# Patient Record
Sex: Female | Born: 1985 | Race: White | Hispanic: No | Marital: Married | Smoking: Current some day smoker
Health system: Southern US, Community
[De-identification: ages and names within clinical notes are randomized; demographics above are authoritative.]

## PROBLEM LIST (undated history)

## (undated) DIAGNOSIS — N200 Calculus of kidney: Secondary | ICD-10-CM

## (undated) DIAGNOSIS — F32A Depression, unspecified: Secondary | ICD-10-CM

## (undated) DIAGNOSIS — F329 Major depressive disorder, single episode, unspecified: Secondary | ICD-10-CM

## (undated) DIAGNOSIS — F419 Anxiety disorder, unspecified: Secondary | ICD-10-CM

## (undated) DIAGNOSIS — N159 Renal tubulo-interstitial disease, unspecified: Secondary | ICD-10-CM

## (undated) DIAGNOSIS — F4310 Post-traumatic stress disorder, unspecified: Secondary | ICD-10-CM

## (undated) HISTORY — PX: CHOLECYSTECTOMY: SHX55

## (undated) HISTORY — PX: TONSILLECTOMY: SUR1361

---
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
## 2007-05-10 ENCOUNTER — Emergency Department (HOSPITAL_COMMUNITY): Admission: EM | Admit: 2007-05-10 | Discharge: 2007-05-10 | Payer: Self-pay | Admitting: Emergency Medicine

## 2007-12-03 ENCOUNTER — Emergency Department (HOSPITAL_COMMUNITY): Admission: EM | Admit: 2007-12-03 | Discharge: 2007-12-03 | Payer: Self-pay | Admitting: Emergency Medicine

## 2007-12-12 ENCOUNTER — Encounter (HOSPITAL_COMMUNITY): Admission: RE | Admit: 2007-12-12 | Discharge: 2008-01-11 | Payer: Self-pay | Admitting: Emergency Medicine

## 2007-12-12 ENCOUNTER — Ambulatory Visit (HOSPITAL_COMMUNITY): Payer: Self-pay | Admitting: Emergency Medicine

## 2008-03-31 ENCOUNTER — Emergency Department (HOSPITAL_COMMUNITY): Admission: EM | Admit: 2008-03-31 | Discharge: 2008-03-31 | Payer: Self-pay | Admitting: Emergency Medicine

## 2008-07-11 ENCOUNTER — Emergency Department (HOSPITAL_COMMUNITY): Admission: EM | Admit: 2008-07-11 | Discharge: 2008-07-11 | Payer: Self-pay | Admitting: Emergency Medicine

## 2008-08-28 ENCOUNTER — Other Ambulatory Visit: Admission: RE | Admit: 2008-08-28 | Discharge: 2008-08-28 | Payer: Self-pay | Admitting: Obstetrics and Gynecology

## 2008-09-09 ENCOUNTER — Emergency Department (HOSPITAL_COMMUNITY): Admission: EM | Admit: 2008-09-09 | Discharge: 2008-09-09 | Payer: Self-pay | Admitting: Emergency Medicine

## 2008-09-22 ENCOUNTER — Emergency Department (HOSPITAL_COMMUNITY): Admission: EM | Admit: 2008-09-22 | Discharge: 2008-09-22 | Payer: Self-pay | Admitting: Emergency Medicine

## 2008-11-05 ENCOUNTER — Inpatient Hospital Stay (HOSPITAL_COMMUNITY): Admission: AD | Admit: 2008-11-05 | Discharge: 2008-11-05 | Payer: Self-pay | Admitting: Obstetrics and Gynecology

## 2008-11-26 ENCOUNTER — Ambulatory Visit: Payer: Self-pay | Admitting: Obstetrics & Gynecology

## 2008-11-26 ENCOUNTER — Inpatient Hospital Stay (HOSPITAL_COMMUNITY): Admission: AD | Admit: 2008-11-26 | Discharge: 2008-11-26 | Payer: Self-pay | Admitting: Obstetrics & Gynecology

## 2009-03-08 ENCOUNTER — Inpatient Hospital Stay (HOSPITAL_COMMUNITY): Admission: AD | Admit: 2009-03-08 | Discharge: 2009-03-09 | Payer: Self-pay | Admitting: Obstetrics & Gynecology

## 2009-03-08 ENCOUNTER — Ambulatory Visit: Payer: Self-pay | Admitting: Family

## 2009-04-12 ENCOUNTER — Ambulatory Visit: Payer: Self-pay | Admitting: Obstetrics and Gynecology

## 2009-04-12 ENCOUNTER — Inpatient Hospital Stay (HOSPITAL_COMMUNITY): Admission: AD | Admit: 2009-04-12 | Discharge: 2009-04-15 | Payer: Self-pay | Admitting: Obstetrics and Gynecology

## 2010-09-01 ENCOUNTER — Emergency Department (HOSPITAL_COMMUNITY)
Admission: EM | Admit: 2010-09-01 | Discharge: 2010-09-01 | Payer: Self-pay | Source: Home / Self Care | Admitting: Emergency Medicine

## 2010-10-11 ENCOUNTER — Encounter: Payer: Self-pay | Admitting: Obstetrics and Gynecology

## 2010-11-30 LAB — URINALYSIS, ROUTINE W REFLEX MICROSCOPIC
Bilirubin Urine: NEGATIVE
Glucose, UA: NEGATIVE mg/dL
Nitrite: NEGATIVE
Specific Gravity, Urine: 1.03 (ref 1.005–1.030)
pH: 5 (ref 5.0–8.0)

## 2010-12-27 LAB — TYPE AND SCREEN: ABO/RH(D): O NEG

## 2010-12-27 LAB — CBC
HCT: 33.8 % — ABNORMAL LOW (ref 36.0–46.0)
Hemoglobin: 10.9 g/dL — ABNORMAL LOW (ref 12.0–15.0)
MCHC: 35.4 g/dL (ref 30.0–36.0)
MCHC: 35.6 g/dL (ref 30.0–36.0)
Platelets: 170 10*3/uL (ref 150–400)
RBC: 3.93 MIL/uL (ref 3.87–5.11)
RDW: 13.9 % (ref 11.5–15.5)
WBC: 14.6 10*3/uL — ABNORMAL HIGH (ref 4.0–10.5)

## 2010-12-27 LAB — RPR: RPR Ser Ql: NONREACTIVE

## 2010-12-28 LAB — URINALYSIS, ROUTINE W REFLEX MICROSCOPIC
Bilirubin Urine: NEGATIVE
Glucose, UA: 250 mg/dL — AB
Ketones, ur: 15 mg/dL — AB
pH: 6 (ref 5.0–8.0)

## 2011-01-05 LAB — URINALYSIS, ROUTINE W REFLEX MICROSCOPIC
Glucose, UA: NEGATIVE mg/dL
Ketones, ur: 40 mg/dL — AB
Nitrite: NEGATIVE
Protein, ur: NEGATIVE mg/dL
Specific Gravity, Urine: >1.03 — ABNORMAL HIGH (ref 1.005–1.030)
pH: 6 (ref 5.0–8.0)

## 2011-02-05 NOTE — H&P (Signed)
NAMETANVI, [name]                 ACCOUNT NO.:  [id number]   MEDICAL RECORD NO.:  [id number]          PATIENT TYPE:  INP   LOCATION:  A413                          FACILITY:  [facility]   PHYSICIAN:  Tilda Burrow, M.D. DATE OF BIRTH:  [birth year]   DATE OF ADMISSION:  10/07/2006  DATE OF DISCHARGE:  LH                              HISTORY & PHYSICAL   REASON FOR ADMISSION:  Pregnancy at 40 weeks for elective induction of  labor due to maternal fatigue and insomnia.   After careful discussion with [name] and her mother, discussing risks and  benefits including increased risk for cesarean section, [name] opts for  elective induction of labor at term due to fatigue, discomfort and  insomnia.   MEDICAL HISTORY:  Positive for depression.   SURGICAL HISTORY:  Positive for tonsils.   FAMILY HISTORY:  Positive for coronary artery disease, hypertension,  cancer, diabetes, and stroke.   PRENATAL COURSE:  Pretty much uneventful at this point. Blood type is O  negative. She did receive RhoGAM at 28 weeks. Rubella is immune.  Hepatitis B surface antigen is negative. HIV is nonreactive. Serology is  nonreactive. Pap normal. GC and chlamydia and blood cultures were  negative. AFP normal. GBS was positive. One-hour glucose was 125.   PHYSICAL EXAMINATION:  Today, weight is 180, blood pressure is 118/70.  She has 2+ glucose. Blood sugar is 105.  Fundal height is 36 cm. Fetal heart rate is 156 ____________. cervix is  1 to 2 cm, posterior, about 60% effaced, -2 station.   PLAN:  We are going to admit on January 18 for Foley bulb induction of  labor.      Zerita Boers, Lanier Clam      Tilda Burrow, M.D.  Electronically Signed    DL/MEDQ  D:  16/06/9603  T:  10/05/2006  Job:  [id number]   cc:   Francoise Schaumann. Milford Cage DO, FAAP  Fax: [phone]

## 2011-02-05 NOTE — H&P (Signed)
NAMESALEEMA, [name]                 ACCOUNT NO.:  [id number]   MEDICAL RECORD NO.:  [id number]          PATIENT TYPE:  INP   LOCATION:  LDR1                          FACILITY:  [facility]   PHYSICIAN:  Tilda Burrow, M.D. DATE OF BIRTH:  [birth year]   DATE OF ADMISSION:  10/07/2006  DATE OF DISCHARGE:  LH                              HISTORY & PHYSICAL   ADMISSION DIAGNOSES:  Pregnancy [redacted] weeks gestation, elective induction,  patient request.   HISTORY OF PRESENT ILLNESS:  This 25 year old female gravida 1, para 0  due October 07, 2006, by menstrual history January 17 and January 15 by  first and second trimester ultrasound, respectively, is admitted October 07, 2006, for elective induction of life birth term per patient request.  She has requested this for social reasons as she has family that will no  longer be able to stay in the area that wished to be here for delivery.  She understands that the like of induction carries with it the usual  risk of labor including need for cesarean delivery or other  interventions.   PAST MEDICAL HISTORY:  Positive for depression as a child due to social  difficulties.   SOCIAL HISTORY:  Tonsillectomy.   HABITS:  Cigarettes, regular use.  Alcohol, denied.  Recreational drugs,  denied.   ALLERGIES:  None.   SOCIAL HISTORY:  Single.  Baby's father, [name], was initially  estranged and has returned and is a support person at this time.  She  was treated with Cymbalta preemptively in the third trimester beginning  September 19, and has discontinued this in early December.  She seems  stable at this time.   PHYSICAL EXAMINATION:  GENERAL:  Healthy Caucasian female in terms of  fetus vertex presentation. Cervix is relatively high.  Yesterday,  January 18, when Foley bulb was placed with vertex presentation  confirmed.  Today, amniotomy is performed.  She is on Pitocin.  It is  now 8 a.m. and the cervix is 5 cm, 50%, -2.   PRENATAL  LABORATORIES:  Blood type O negative, rubella immune at  present.  Hemoglobin 13, hematocrit 38.  Hepatitis, HIV, RPR, GC,  Chlamydia all negative.  Group B strep positive.  Glucose dropped to 125  mg percent.   PLAN:  Continue Pitocin.  The patient does not wish epidural.  She will  expect vaginal delivery.     Tilda Burrow, M.D.  Electronically Signed    JVF/MEDQ  D:  10/08/2006  T:  10/08/2006  Job:  [id number]

## 2011-02-05 NOTE — H&P (Signed)
[name], [name]                 ACCOUNT NO.:  [id number]   MEDICAL RECORD NO.:  [id number]          PATIENT TYPE:  OIB   LOCATION:  LDR1                          FACILITY:  [facility]   PHYSICIAN:  Tilda Burrow, M.D. DATE OF BIRTH:  09/13/1986   DATE OF ADMISSION:  06/27/2006  DATE OF DISCHARGE:  LH                                HISTORY & PHYSICAL   REASON FOR ADMISSION:  Pregnancy at approximately 26 weeks with occasional  uterine contractions approximately 45 minutes apart per patient.   MEDICAL HISTORY:  Positive for depression.   PAST SURGICAL HISTORY:  Positive for tonsils.   ALLERGIES:  She has no known allergies.   FAMILY HISTORY:  Positive for coronary artery disease, hypertension, cancer,  diabetes and stroke.   Blood types are negative.  UDS is negative.  Rubella is immuned.  Hepatitis  B surface antigen is negative.  HIV is negative.  Serology is nonreactive.  Pap is normal.  GC and Chlamydia are negative.  AFP normal.  One-hour  glucose 125.   PHYSICAL EXAMINATION:  Vital signs are stable.  Fetal heart rate pattern is  stable.  Cervix is closed, anterior, thick, presenting part is high.   PLAN:  We are going to observe for two hours.  If no regular uterine  contractions, we will discharge the patient home to follow up next week.      Zerita Boers, Lanier Clam      Tilda Burrow, M.D.  Electronically Signed    DL/MEDQ  D:  11/91/4782  T:  06/27/2006  Job:  [id number]   cc:   [facility]

## 2011-02-05 NOTE — Group Therapy Note (Signed)
[name], [name]                 ACCOUNT NO.:  [id number]   MEDICAL RECORD NO.:  [id number]          PATIENT TYPE:  OIB   LOCATION:  LDR3                          FACILITY:  [facility]   PHYSICIAN:  Tilda Burrow, M.D. DATE OF BIRTH:  Oct 27, 1985   DATE OF PROCEDURE:  DATE OF DISCHARGE:  10/06/2006                                 PROGRESS NOTE   [name] was originally scheduled for elective induction secondary to  psycho/social issues, mainly her mother having come from [place] to  be with her when she delivered and having to go back Sunday. However, at  this point, right before [name] arrived, we had a delivery of a baby who  wound up being observed in the nursery so the census is too high for an  elective induction. She had a very nice reactive NST with negative  uterine activity. Her cervix is 2-cm, 50% effaced, soft and -1 station.  So, if the census quiets down tomorrow, she is going to come back in the  evening for the induction of labor. We have asked her to call Labor and  Delivery prior to coming to make sure that the census is low enough to  accommodate her.      [name], C.N.M.      Tilda Burrow, M.D.  Electronically Signed    FC/MEDQ  D:  10/06/2006  T:  10/06/2006  Job:  [id number]   cc:   [facility]  [address], [address]  [place], [place] [address]

## 2011-02-05 NOTE — Group Therapy Note (Signed)
NAMEEUSTOLIA, DRENNEN                 ACCOUNT NO.:  [id number]   MEDICAL RECORD NO.:  [id number]          PATIENT TYPE:  OIB   LOCATION:  LDR1                          FACILITY:  [facility]   PHYSICIAN:  Tilda Burrow, M.D. DATE OF BIRTH:  27-Jul-1986   DATE OF PROCEDURE:  DATE OF DISCHARGE:  09/27/2006                                 PROGRESS NOTE   [name] is about [redacted] weeks pregnant with her first baby, came in with  complaints of lower abdominal cramping.  Her cervix is soft but closed.  The fetal heart rate is beautifully reactive.  She looks to be having  more irritability than any contractions.  Her urinalysis was negative.  She was encouraged to drink lot of fluids, might help quiet down her  uterus.  Otherwise return to Labor and Delivery if she gets a pattern of  contractions.      Jacklyn Shell, C.N.M.      Tilda Burrow, M.D.  Electronically Signed    FC/MEDQ  D:  09/27/2006  T:  09/27/2006  Job:  [id number]   cc:   [facility]

## 2011-02-05 NOTE — Consult Note (Signed)
[name], [name]                 ACCOUNT NO.:  [id number]   MEDICAL RECORD NO.:  [id number]          PATIENT TYPE:  OIB   LOCATION:  LDR1                          FACILITY:  [facility]   PHYSICIAN:  Lazaro Arms, M.D.   DATE OF BIRTH:  Sep 10, 1986   DATE OF CONSULTATION:  DATE OF DISCHARGE:                                   CONSULTATION   HISTORY OF PRESENT ILLNESS:  [name] is an 25 year old gravida 1, para 0,  estimated date of delivery of October 07, 2006, currently 25-3/[redacted] weeks  gestation who presented to Labor and Delivery complaining of lower back  pain.  She had a urinalysis that was essentially negative except for  bacterial and leukocyte esterase.  It was negative for nitrites.  She had a  dose of Ancef.  She had no labor.  Cervix was long, thick and closed.  No  bleeding.  No other complaints.  As a result, she is discharged to home this  morning.  There is no CVA tenderness and her urine drug screen was negative.  Her white count was 14,000 with a slight shift of 79 segs.  Again, no CVA  tenderness indicative of pyelo.  She is discharged to home on Macrobid to  take b.i.d. x7 days, and again, she has received Ancef IV.      Lazaro Arms, M.D.  Electronically Signed     LHE/MEDQ  D:  06/28/2006  T:  06/28/2006  Job:  [id number]

## 2011-02-05 NOTE — Op Note (Signed)
NAMEAVANNA, [name]                 ACCOUNT NO.:  [id number]   MEDICAL RECORD NO.:  [id number]          PATIENT TYPE:  INP   LOCATION:  LDR1                          FACILITY:  [facility]   PHYSICIAN:  Tilda Burrow, M.D. DATE OF BIRTH:  May 30, 1986   DATE OF PROCEDURE:  10/08/2006  DATE OF DISCHARGE:                               OPERATIVE REPORT   EPIDURAL CATHETER NOTE:  1:45 p.m., October 08, 2006.   Continuous lumbar epidural catheter placed at L2-3 interspace.  First  attempt, back was prepped and draped after informed consent obtained  including specific review of spinal headaches.  We then proceeded with  epidural after prepping and draping.  The L3-4 interspace was initially  attempted and bony obstruction encountered and efforts discontinued  without any access to the epidural space or any suspicion or  complications.  L2-3 interspace was easily used to identify loss of  resistance technique at 4.5-5 cm beneath the skin with a 3 mL test dose  of 1.5% Xylocaine with epinephrine infused, the epidural catheter  threaded 3 cm into the epidural space, additional 3 mL of 1% Xylocaine  with epinephrine instilled, injected through the catheter with no  tachycardia noted either time.  The patient then had the catheter  connected to the continuous infusion with 7 mL of bolus of 0.125%  Marcaine with 2 mcg/mL of fentanyl used.  The patient had excellent  analgesic effect within two contractions, was able to move the legs but  had symmetric T10 analgesia.  The patient is tolerating labor well.  Cervical exam post epidural shows the cervix to be 6 cm, stretchable to  7, 90%, -1 station, with some molding present.  Will monitor for  adequacy of labor.      Tilda Burrow, M.D.  Electronically Signed     JVF/MEDQ  D:  10/08/2006  T:  10/08/2006  Job:  [id number]

## 2011-02-05 NOTE — Consult Note (Signed)
[name], [name]                 ACCOUNT NO.:  [id number]   MEDICAL RECORD NO.:  [id number]          PATIENT TYPE:  OIB   LOCATION:  A415                          FACILITY:  [facility]   PHYSICIAN:  Tilda Burrow, M.D. DATE OF BIRTH:  June 24, 1986   DATE OF CONSULTATION:  DATE OF DISCHARGE:  07/16/2006                                   CONSULTATION   OBSERVATION SUMMARY:  Observation for 8 hours.   CHIEF COMPLAINT:  Back pain, bilateral, left greater than right. Mild  abdominal discomfort with contractions.   HISTORY OF PRESENT ILLNESS:  This 25 year old primiparous female, LMP ?  December 31, 2005, placing menstrual EDC ? October 07, 2005 with ultrasound [facility]  of January 15 and January 17, is admitted after calling complaining of  contractions every 15 minutes as well as back discomfort. She had a prenatal  course which was uneventful today with her at 31 weeks' gestation by  consensus criteria. Urinalysis has been negative at the last 2 visits. She  has had spontaneous glycosuria with negative workup and has had one-hour  glucose-tolerance tests x2 that were negative.   HOSPITAL COURSE:  She was placed under observation status. Laboratory  assessment includes white count elevated at 19,100, 80 neutrophils,  hemoglobin 11, hematocrit 31. Urinalysis was interestingly positive for  glucosuria at greater than 1000 mg/dL but was negative for nitrites and  esterase. Microscopic showed presence of a few bacteria and 0 to 2 white  cells. Clinical exam showed an excellent fetal heart tracing, reactive. She  was assessed and had low back discomfort as well as questionable left CVA  tenderness. Observation overnight was accompanied by IV antibiotic therapy  with Ancef. She felt subjectively significant better the following morning.  __Fetal Monitoring_ were repeated she had absolutely no contractions  throughout the observation. She was considered stable for discharge,  afebrile, feeling better  with antibiotics continued for 7 days as a  precaution.   ADDENDUM:  Follow up in 4 days at [facility].      Tilda Burrow, M.D.  Electronically Signed     JVF/MEDQ  D:  07/16/2006  T:  07/16/2006  Job:  [id number]

## 2011-02-05 NOTE — Op Note (Signed)
[name], [name]                 ACCOUNT NO.:  [id number]   MEDICAL RECORD NO.:  [id number]          PATIENT TYPE:  INP   LOCATION:  LDR1                          FACILITY:  [facility]   PHYSICIAN:  Tilda Burrow, M.D. DATE OF BIRTH:  05/13/1986   DATE OF PROCEDURE:  DATE OF DISCHARGE:                               OPERATIVE REPORT   Delivery, 6:15 p.m.   LABOR SUMMARY/DELIVERY NOTE:  [name] progressed slowly through the day.  She had epidural, as described in the earlier note.  She reached  completely dilated at 5:30 p.m. with a strong urge to push.  The baby  was found to be in the occiput posterior position.  She had a strong  pushing effort, was able to have the Foley catheter remove and push over  a 45 minute second stage, steadily bringing the baby down.  The vertex  spontaneously rotated to an occiput transverse position.  We were having  some variable decelerations into the 80s and persistently low.  At  approximately 6:00, the vacuum assistance was positioned after  acceptance by family and used on one contraction to see if it would help  with the spontaneous expulsion of the head.  It did not, so it was  discontinued.  The patient continued to push and over the next 15  minutes, I rotated the baby to a left occiput anterior position, where  the corona of the baby was able to be delivered over an intact  peritoneum without any episiotomy but receiving a second-degree  laceration at approximately 4 o'clock position at the introitus.  The  amniotic fluid was much clearer around the face and nose than it had  been in the forewaters.  It was felt that DeLee suction would be  sufficient, so suctioning of the nose and pharynx with the DeLee suction  was performed.  The baby's arm delivered.  The cord around the shoulders  slipped over the body, and the baby expelled completely.  Bulb suction  of the nose and pharynx followed.  Fluid was quite clear.  Patient then  had baby placed  on the abdomen, cord clamped, cut by the baby's father,  and then placed in the warmer for further attention by nurses.   The patient then had easy Schultze delivery of the placenta, cord blood  samples having been obtained.  The placenta was intact upon expulsion.  EBL 450 cc.  Repair was a two-layer closure with continuous running 2-0  Vicryl under 2% lidocaine local anesthesia.   Epidural catheter was removed with tip visualized as intact.      Tilda Burrow, M.D.  Electronically Signed     JVF/MEDQ  D:  10/08/2006  T:  10/08/2006  Job:  [id number]

## 2011-02-05 NOTE — H&P (Signed)
NAMEAARIKA, [name]                 ACCOUNT NO.:  [id number]   MEDICAL RECORD NO.:  [id number]          PATIENT TYPE:  OBV   LOCATION:  A415                          FACILITY:  [facility]   PHYSICIAN:  Lazaro Arms, M.D.   DATE OF BIRTH:  [birth year]   DATE OF ADMISSION:  10/01/2006  DATE OF DISCHARGE:  01/12/2008LH                              HISTORY & PHYSICAL   LABOR AND DELIVERY OBSERVATION NOTE:  [name] is an 25 year old white female, gravida 1, para 0, with estimated  date of delivery of October 07, 2006, currently 93 and 2/7ths weeks  gestation, who came into labor and delivery complaining of contractions.  She indeed is having some uterine activity.  It is fairly irregular  every 5 to 10 to 12 minutes.  When she came in last night at  approximately 12:30, her cervix was 1 cm.  She was having reactive NST.  I gave kept her here now for about 9-10 hours, and she is still 1 cm.  There has been no discernible cervical change which probably represents  prodromal labor.  She has had a reactive NST.  No bleeding, no other  problems.  As a result, she is discharged to home, given labor  precautions and instructions.  She will keep her scheduled appointment,  and I told her she may expect to come back in labor in the next few  days.  She and the father of the baby understand this, and will keep  appointments as scheduled.      Lazaro Arms, M.D.  Electronically Signed     LHE/MEDQ  D:  10/01/2006  T:  10/01/2006  Job:  [id number]

## 2011-03-20 ENCOUNTER — Emergency Department (HOSPITAL_COMMUNITY)
Admission: EM | Admit: 2011-03-20 | Discharge: 2011-03-20 | Disposition: A | Discharge status: Home / Self Care | Payer: Self-pay | Attending: Emergency Medicine | Admitting: Emergency Medicine

## 2011-03-20 DIAGNOSIS — K089 Disorder of teeth and supporting structures, unspecified: Secondary | ICD-10-CM | POA: Insufficient documentation

## 2011-06-17 LAB — URINALYSIS, ROUTINE W REFLEX MICROSCOPIC: Nitrite: NEGATIVE

## 2011-06-17 LAB — CBC
MCHC: 34.2
MCV: 86.5

## 2011-06-17 LAB — URINE MICROSCOPIC-ADD ON

## 2011-06-17 LAB — DIFFERENTIAL
Basophils Relative: 1
Eosinophils Absolute: 0.3
Lymphocytes Relative: 30
Lymphs Abs: 3.1
Monocytes Absolute: 0.7
Monocytes Relative: 7
Neutro Abs: 6

## 2011-06-25 LAB — CULTURE, ROUTINE-ABSCESS

## 2011-07-02 LAB — URINALYSIS, ROUTINE W REFLEX MICROSCOPIC
Glucose, UA: NEGATIVE
Protein, ur: NEGATIVE
Specific Gravity, Urine: 1.02

## 2011-07-02 LAB — COMPREHENSIVE METABOLIC PANEL
ALT: 28
BUN: 13
Calcium: 9
Glucose, Bld: 90
Sodium: 137
Total Protein: 6.7

## 2011-07-02 LAB — URINE CULTURE: Colony Count: 40000

## 2011-07-02 LAB — CBC
Hemoglobin: 14.4
MCHC: 34.8
Platelets: 176
RDW: 12.9

## 2011-07-02 LAB — URINE MICROSCOPIC-ADD ON

## 2011-07-02 LAB — DIFFERENTIAL
Lymphs Abs: 0.9
Monocytes Relative: 5
Neutro Abs: 10.1 — ABNORMAL HIGH
Neutrophils Relative %: 86 — ABNORMAL HIGH

## 2012-01-24 ENCOUNTER — Emergency Department (HOSPITAL_COMMUNITY)
Admission: EM | Admit: 2012-01-24 | Discharge: 2012-01-24 | Disposition: A | Discharge status: Home / Self Care | Payer: Self-pay | Attending: Emergency Medicine | Admitting: Emergency Medicine

## 2012-01-24 ENCOUNTER — Encounter (HOSPITAL_COMMUNITY): Payer: Self-pay | Admitting: Emergency Medicine

## 2012-01-24 DIAGNOSIS — R300 Dysuria: Secondary | ICD-10-CM | POA: Insufficient documentation

## 2012-01-24 DIAGNOSIS — N390 Urinary tract infection, site not specified: Secondary | ICD-10-CM | POA: Insufficient documentation

## 2012-01-24 DIAGNOSIS — F17200 Nicotine dependence, unspecified, uncomplicated: Secondary | ICD-10-CM | POA: Insufficient documentation

## 2012-01-24 DIAGNOSIS — R109 Unspecified abdominal pain: Secondary | ICD-10-CM | POA: Insufficient documentation

## 2012-01-24 DIAGNOSIS — R3915 Urgency of urination: Secondary | ICD-10-CM | POA: Insufficient documentation

## 2012-01-24 DIAGNOSIS — R350 Frequency of micturition: Secondary | ICD-10-CM | POA: Insufficient documentation

## 2012-01-24 LAB — URINE MICROSCOPIC-ADD ON

## 2012-01-24 LAB — URINALYSIS, ROUTINE W REFLEX MICROSCOPIC
Nitrite: POSITIVE — AB
Urobilinogen, UA: 4 mg/dL — ABNORMAL HIGH (ref 0.0–1.0)

## 2012-01-24 MED ORDER — HYDROCODONE-ACETAMINOPHEN 5-325 MG PO TABS
1.0000 | ORAL_TABLET | Freq: Once | ORAL | Status: AC
  Administered 2012-01-24: 1 via ORAL
  Filled 2012-01-24: qty 1

## 2012-01-24 MED ORDER — IBUPROFEN 800 MG PO TABS
800.0000 mg | ORAL_TABLET | Freq: Once | ORAL | Status: AC
  Administered 2012-01-24: 800 mg via ORAL
  Filled 2012-01-24: qty 1

## 2012-01-24 MED ORDER — CIPROFLOXACIN HCL 250 MG PO TABS: 250.0000 mg | ORAL_TABLET | Freq: Two times a day (BID) | ORAL | Status: AC

## 2012-01-24 MED ORDER — PHENAZOPYRIDINE HCL 200 MG PO TABS: 200.0000 mg | ORAL_TABLET | Freq: Three times a day (TID) | ORAL | Status: AC

## 2012-01-24 MED ORDER — CIPROFLOXACIN HCL 250 MG PO TABS
500.0000 mg | ORAL_TABLET | Freq: Once | ORAL | Status: AC
  Administered 2012-01-24: 500 mg via ORAL
  Filled 2012-01-24: qty 2

## 2012-01-24 MED ORDER — HYDROCODONE-ACETAMINOPHEN 5-325 MG PO TABS: 2.0000 | ORAL_TABLET | Freq: Once | ORAL | Status: DC

## 2012-01-24 MED ORDER — PHENAZOPYRIDINE HCL 100 MG PO TABS
200.0000 mg | ORAL_TABLET | Freq: Once | ORAL | Status: AC
  Administered 2012-01-24: 200 mg via ORAL
  Filled 2012-01-24: qty 2

## 2012-01-24 MED ORDER — HYDROCODONE-ACETAMINOPHEN 5-325 MG PO TABS
1.0000 | ORAL_TABLET | ORAL | Status: AC | PRN
Start: 2012-01-24 — End: 2012-02-03

## 2012-01-24 NOTE — Discharge Instructions (Signed)
Drink lots of fluids. Take all of the antibiotic. Use the pain medicine as needed.   Urinary Tract Infection A urinary tract infection (UTI) is often caused by a germ (bacteria). A UTI is usually helped with medicine (antibiotics) that kills germs. Take all the medicine until it is gone. Do this even if you are feeling better. You are usually better in 7 to 10 days. HOME CARE   Drink enough water and fluids to keep your pee (urine) clear or pale yellow. Drink:   Cranberry juice.   Water.   Avoid:   Caffeine.   Tea.   Bubbly (carbonated) drinks.   Alcohol.   Only take medicine as told by your doctor.   To prevent further infections:   Pee often.   After pooping (bowel movement), women should wipe from front to back. Use each tissue only once.   Pee before and after having sex (intercourse).  Ask your doctor when your test results will be ready. Make sure you follow up and get your test results.  GET HELP RIGHT AWAY IF:   There is very bad back pain or lower belly (abdominal) pain.   You get the chills.   You have a fever.   Your baby is older than 3 months with a rectal temperature of 102 F (38.9 C) or higher.   Your baby is 20 months old or younger with a rectal temperature of 100.4 F (38 C) or higher.   You feel sick to your stomach (nauseous) or throw up (vomit).   There is continued burning with peeing.   Your problems are not better in 3 days. Return sooner if you are getting worse.  MAKE SURE YOU:   Understand these instructions.   Will watch your condition.   Will get help right away if you are not doing well or get worse.  Document Released: 02/23/2008 Document Revised: 08/26/2011 Document Reviewed: 02/23/2008 [facility] Patient Information 2012 [place], [place].

## 2012-01-24 NOTE — ED Notes (Signed)
States frequent UTI - has always used AZO with some relief.  Tonight took AZO an hour before coming to ED.  Pain and frequency much worse than prior to taking the medication.  Voiding about every 5 minutes - states as soon as she finishes feels like she must go back again

## 2012-01-24 NOTE — ED Provider Notes (Signed)
History     CSN: [id number]  Arrival date & time 01/24/12  0159   First MD Initiated Contact with Patient 01/24/12 0202      Chief Complaint  Patient presents with  . Flank Pain    frequent urination    (Consider location/radiation/quality/duration/timing/severity/associated sxs/prior treatment) HPI   [name] is a 26 y.o. female who presents to the Emergency Department complaining of urinary frequency, urgency, dysuria, and right flank pain that began about 3 hours before coming to the emergency room. She initiated AZO standard which seem to increase the pain with urination. She denies fever, chills, nausea, vomiting, diarrhea.  PCP Dr. Dimas Aguas   History reviewed. No pertinent past medical history.  Past Surgical History  Procedure Date  . Tonsillectomy   . Anterior cruciate ligament repair 2005    No family history on file.  History  Substance Use Topics  . Smoking status: Current Everyday Smoker -- 0.5 packs/day    Types: Cigarettes  . Smokeless tobacco: Not on file  . Alcohol Use: Yes     rarely    OB History    Grav Para Term Preterm Abortions TAB SAB Ect Mult Living                  Review of Systems  Constitutional: Negative for fever.       10 Systems reviewed and are negative for acute change except as noted in the HPI.  HENT: Negative for congestion.   Eyes: Negative for discharge and redness.  Respiratory: Negative for cough and shortness of breath.   Cardiovascular: Negative for chest pain.  Gastrointestinal: Negative for vomiting and abdominal pain.  Genitourinary: Positive for dysuria, urgency, frequency, hematuria and flank pain.  Musculoskeletal: Negative for back pain.  Skin: Negative for rash.  Neurological: Negative for syncope, numbness and headaches.  Psychiatric/Behavioral:       No behavior change.    Allergies  Review of patient's allergies indicates no known allergies.  Home Medications  No current outpatient  prescriptions on file.  BP 130/74  Pulse 92  Temp(Src) 97.9 F (36.6 C) (Oral)  Resp 16  Ht 5\' 3"  (1.6 m)  Wt 183 lb (83.008 kg)  BMI 32.42 kg/m2  SpO2 100%  LMP 12/27/2011  Physical Exam  Nursing note and vitals reviewed. Constitutional:       Awake, alert, nontoxic appearance.  HENT:  Head: Atraumatic.  Eyes: Right eye exhibits no discharge. Left eye exhibits no discharge.  Neck: Neck supple.  Cardiovascular: Normal rate, normal heart sounds and intact distal pulses.   Pulmonary/Chest: Effort normal. She exhibits no tenderness.  Abdominal: Soft. Bowel sounds are normal. There is no tenderness. There is no rebound.  Genitourinary:       Right CVA tenderness  Musculoskeletal: She exhibits no tenderness.       Baseline ROM, no obvious new focal weakness.  Neurological:       Mental status and motor strength appears baseline for patient and situation.  Skin: No rash noted.  Psychiatric: She has a normal mood and affect.    ED Course  Procedures (including critical care time)  Results for orders placed during the hospital encounter of 01/24/12  URINALYSIS, ROUTINE W REFLEX MICROSCOPIC      Component Value Range   Color, Urine ORANGE (*) YELLOW    APPearance CLOUDY (*) CLEAR    Specific Gravity, Urine 1.025  1.005 - 1.030    pH 5.5  5.0 - 8.0  Glucose, UA 250 (*) NEGATIVE (mg/dL)   Hgb urine dipstick LARGE (*) NEGATIVE    Bilirubin Urine SMALL (*) NEGATIVE    Ketones, ur TRACE (*) NEGATIVE (mg/dL)   Protein, ur >161 (*) NEGATIVE (mg/dL)   Urobilinogen, UA 4.0 (*) 0.0 - 1.0 (mg/dL)   Nitrite POSITIVE (*) NEGATIVE    Leukocytes, UA MODERATE (*) NEGATIVE   PREGNANCY, URINE      Component Value Range   Preg Test, Ur NEGATIVE  NEGATIVE   URINE MICROSCOPIC-ADD ON      Component Value Range   Squamous Epithelial / LPF RARE  RARE    WBC, UA TOO NUMEROUS TO COUNT  <3 (WBC/hpf)   RBC / HPF TOO NUMEROUS TO COUNT  <3 (RBC/hpf)   Bacteria, UA MANY (*) RARE         MDM  Patient with a three-hour history of dysuria, urgency, frequency and right flank pain. Given analgesics, anti-inflammatory, and Pyridium. UA is positive for infection. Initiated antibiotic therapy.Pt stable in ED with no significant deterioration in condition.The patient appears reasonably screened and/or stabilized for discharge and I doubt any other medical condition or other [facility] requiring further screening, evaluation, or treatment in the ED at this time prior to discharge.  MDM Reviewed: nursing note and vitals Interpretation: labs           Nicoletta Dress. Colon Branch, MD 01/24/12 [phone]

## 2012-07-25 ENCOUNTER — Encounter (HOSPITAL_COMMUNITY): Payer: Self-pay

## 2012-07-25 ENCOUNTER — Emergency Department (HOSPITAL_COMMUNITY)
Admission: EM | Admit: 2012-07-25 | Discharge: 2012-07-25 | Disposition: A | Discharge status: Home / Self Care | Payer: Self-pay | Attending: Emergency Medicine | Admitting: Emergency Medicine

## 2012-07-25 DIAGNOSIS — R112 Nausea with vomiting, unspecified: Secondary | ICD-10-CM | POA: Insufficient documentation

## 2012-07-25 DIAGNOSIS — R197 Diarrhea, unspecified: Secondary | ICD-10-CM | POA: Insufficient documentation

## 2012-07-25 DIAGNOSIS — F17200 Nicotine dependence, unspecified, uncomplicated: Secondary | ICD-10-CM | POA: Insufficient documentation

## 2012-07-25 DIAGNOSIS — K529 Noninfective gastroenteritis and colitis, unspecified: Secondary | ICD-10-CM

## 2012-07-25 DIAGNOSIS — K5289 Other specified noninfective gastroenteritis and colitis: Secondary | ICD-10-CM | POA: Insufficient documentation

## 2012-07-25 DIAGNOSIS — R1013 Epigastric pain: Secondary | ICD-10-CM | POA: Insufficient documentation

## 2012-07-25 MED ORDER — ONDANSETRON HCL 4 MG/2ML IJ SOLN
4.0000 mg | Freq: Once | INTRAMUSCULAR | Status: AC
  Administered 2012-07-25: 4 mg via INTRAVENOUS
  Filled 2012-07-25: qty 2

## 2012-07-25 MED ORDER — ONDANSETRON HCL 8 MG PO TABS: 8.0000 mg | ORAL_TABLET | ORAL | Status: AC | PRN

## 2012-07-25 MED ORDER — DIPHENOXYLATE-ATROPINE 2.5-0.025 MG PO TABS: 1.0000 | ORAL_TABLET | Freq: Four times a day (QID) | ORAL | Status: DC | PRN

## 2012-07-25 MED ORDER — PANTOPRAZOLE SODIUM 40 MG IV SOLR
40.0000 mg | Freq: Once | INTRAVENOUS | Status: AC
  Administered 2012-07-25: 40 mg via INTRAVENOUS
  Filled 2012-07-25: qty 40

## 2012-07-25 MED ORDER — SODIUM CHLORIDE 0.9 % IV SOLN
Freq: Once | INTRAVENOUS | Status: AC
  Administered 2012-07-25: 16:00:00 via INTRAVENOUS

## 2012-07-25 MED ORDER — SODIUM CHLORIDE 0.9 % IV SOLN: Freq: Once | INTRAVENOUS | Status: DC

## 2012-07-25 NOTE — ED Provider Notes (Signed)
History  This chart was scribed for Donnetta Hutching, MD by Ladona Ridgel Day. This patient was seen in room APA03/APA03 and the patient's care was started at 1438.   CSN: [id number]  Arrival date & time 07/25/12  1438   First MD Initiated Contact with Patient 07/25/12 1533      Chief Complaint  Patient presents with  . Abdominal Pain   Patient is a 26 y.o. female presenting with abdominal pain. The history is provided by the patient. No language interpreter was used.  Abdominal Pain The primary symptoms of the illness include abdominal pain, nausea, vomiting and diarrhea. The primary symptoms of the illness do not include fever or shortness of breath. The current episode started 13 to 24 hours ago. The onset of the illness was gradual. The problem has been gradually worsening.  Symptoms associated with the illness do not include chills or constipation.   [name] is a 26 y.o. female who presents to the Emergency Department complaining of constant gradually worsening abdominal pain associated flu like symptoms and diarrhea. She states multiple emesis eipisodes and diarrhea since about 12 hours ago. She states multiple sick contacts at home all with similar flu-like symptoms. She states associated body aches and fatigue.  History reviewed. No pertinent past medical history.  Past Surgical History  Procedure Date  . Tonsillectomy   . Anterior cruciate ligament repair 2005    No family history on file.  History  Substance Use Topics  . Smoking status: Current Every Day Smoker -- 0.5 packs/day    Types: Cigarettes  . Smokeless tobacco: Not on file  . Alcohol Use: Yes     Comment: rarely    OB History    Grav Para Term Preterm Abortions TAB SAB Ect Mult Living                  Review of Systems  Constitutional: Negative for fever and chills.  Respiratory: Negative for shortness of breath.   Gastrointestinal: Positive for nausea, vomiting, abdominal pain and diarrhea. Negative for  constipation.  Neurological: Negative for weakness.  All other systems reviewed and are negative.  A complete 10 system review of systems was obtained and all systems are negative except as noted in the HPI and PMH.    Allergies  Phenergan  Home Medications   Current Outpatient Rx  Name  Route  Sig  Dispense  Refill  . EMETROL 1.87-1.87-21.5 PO SOLN   Oral   Take 15-30 mLs by mouth once. As needed for nausea           Triage Vitals: BP 114/69  Pulse 88  Temp 98.2 F (36.8 C) (Oral)  Resp 18  Ht 5\' 3"  (1.6 m)  Wt 181 lb (82.101 kg)  BMI 32.06 kg/m2  SpO2 100%  LMP 07/03/2012  Physical Exam  Nursing note and vitals reviewed. Constitutional: She is oriented to person, place, and time. She appears well-developed and well-nourished.  HENT:  Head: Normocephalic and atraumatic.  Eyes: Conjunctivae normal and EOM are normal. Pupils are equal, round, and reactive to light.  Neck: Normal range of motion. Neck supple.  Cardiovascular: Normal rate, regular rhythm and normal heart sounds.   Pulmonary/Chest: Effort normal and breath sounds normal.  Abdominal: Soft. Bowel sounds are normal. There is tenderness (minimal epigastric tendeerness).  Musculoskeletal: Normal range of motion.  Neurological: She is alert and oriented to person, place, and time.  Skin: Skin is warm and dry.  Psychiatric: She has a  normal mood and affect.    ED Course  Procedures (including critical care time) DIAGNOSTIC STUDIES: Oxygen Saturation is 100% on room air, normal by my interpretation.    COORDINATION OF CARE: At 345 PM Discussed treatment plan with patient which includes IV fluids, nausea medicine, protonix. Patient agrees.   Labs Reviewed - No data to display No results found.   No diagnosis found.    MDM   Patient feeling better after IV fluids, Protonix, Zofran. Discharge home with prescription for Zofran 8 mg #8 and Lomotil #15     I personally performed the services  described in this documentation, which was scribed in my presence. The recorded information has been reviewed and considered.          Donnetta Hutching, MD 07/25/12 [phone]

## 2012-07-25 NOTE — ED Notes (Signed)
Pt c/o upper abd pain with n/v/d since 4 am.

## 2012-07-25 NOTE — ED Notes (Signed)
Pt in restroom 

## 2012-09-02 ENCOUNTER — Emergency Department (HOSPITAL_COMMUNITY): Payer: Self-pay

## 2012-09-02 ENCOUNTER — Emergency Department (HOSPITAL_COMMUNITY)
Admission: EM | Admit: 2012-09-02 | Discharge: 2012-09-02 | Disposition: A | Discharge status: Home / Self Care | Payer: Self-pay | Attending: Emergency Medicine | Admitting: Emergency Medicine

## 2012-09-02 ENCOUNTER — Encounter (HOSPITAL_COMMUNITY): Payer: Self-pay

## 2012-09-02 DIAGNOSIS — F17200 Nicotine dependence, unspecified, uncomplicated: Secondary | ICD-10-CM | POA: Insufficient documentation

## 2012-09-02 DIAGNOSIS — Z3202 Encounter for pregnancy test, result negative: Secondary | ICD-10-CM | POA: Insufficient documentation

## 2012-09-02 DIAGNOSIS — N201 Calculus of ureter: Secondary | ICD-10-CM | POA: Insufficient documentation

## 2012-09-02 LAB — URINALYSIS, ROUTINE W REFLEX MICROSCOPIC
Glucose, UA: NEGATIVE mg/dL
Leukocytes, UA: NEGATIVE
Protein, ur: NEGATIVE mg/dL
pH: 5.5 (ref 5.0–8.0)

## 2012-09-02 LAB — URINE MICROSCOPIC-ADD ON

## 2012-09-02 LAB — PREGNANCY, URINE: Preg Test, Ur: NEGATIVE

## 2012-09-02 MED ORDER — ONDANSETRON HCL 4 MG/2ML IJ SOLN
4.0000 mg | Freq: Once | INTRAMUSCULAR | Status: AC
  Administered 2012-09-02: 4 mg via INTRAVENOUS
  Filled 2012-09-02: qty 2

## 2012-09-02 MED ORDER — OXYCODONE-ACETAMINOPHEN 5-325 MG PO TABS: 1.0000 | ORAL_TABLET | ORAL | Status: DC | PRN

## 2012-09-02 MED ORDER — HYDROMORPHONE HCL PF 1 MG/ML IJ SOLN
1.0000 mg | Freq: Once | INTRAMUSCULAR | Status: AC
  Administered 2012-09-02: 1 mg via INTRAVENOUS
  Filled 2012-09-02: qty 1

## 2012-09-02 MED ORDER — CEPHALEXIN 500 MG PO CAPS: 500.0000 mg | ORAL_CAPSULE | Freq: Four times a day (QID) | ORAL | Status: DC

## 2012-09-02 MED ORDER — SODIUM CHLORIDE 0.9 % IV BOLUS (SEPSIS)
1000.0000 mL | Freq: Once | INTRAVENOUS | Status: AC
  Administered 2012-09-02: 1000 mL via INTRAVENOUS

## 2012-09-02 MED ORDER — ONDANSETRON 8 MG PO TBDP: 8.0000 mg | ORAL_TABLET | Freq: Three times a day (TID) | ORAL | Status: DC | PRN

## 2012-09-02 MED ORDER — KETOROLAC TROMETHAMINE 30 MG/ML IJ SOLN
30.0000 mg | Freq: Once | INTRAMUSCULAR | Status: AC
  Administered 2012-09-02: 30 mg via INTRAVENOUS
  Filled 2012-09-02: qty 1

## 2012-09-02 NOTE — ED Notes (Signed)
Called to room. Patient stating sub-sternal chest pain. Vital signs obtained and stable. See Doc flowsheets for vitals. Dr Rosalia Hammers made aware.

## 2012-09-02 NOTE — ED Notes (Signed)
Pt reports left flank pain that started today around 2pm, +n.v, and pain denies any urinary s/s, no vaginal discharge. ?fever at times.

## 2012-09-02 NOTE — ED Provider Notes (Signed)
History     CSN: [id number]  Arrival date & time 09/02/12  1539   First MD Initiated Contact with Patient 09/02/12 1559      Chief Complaint  Patient presents with  . Flank Pain    left     (Consider location/radiation/quality/duration/timing/severity/associated sxs/prior treatment) Patient is a 26 y.o. female presenting with flank pain.  Flank Pain Pertinent negatives include no chest pain, no abdominal pain, no headaches and no shortness of breath.    Patient complaining of left flank pain began about 2 p.m.  Pain radiates to left groin.  LMP 12/7 for 2 days.  Pregnancy was positive then negative- patient was sexually negative now separated.  G2p2.  Denies abnormal vaginal discharge or stds.  Urine dark on last void, no uti symptoms.  FAmily history of kidney stones.  Pain is severe and patient is moving around to decrease pain    History reviewed. No pertinent past medical history.  Past Surgical History  Procedure Date  . Tonsillectomy   . Anterior cruciate ligament repair 2005  . Cholecystectomy     No family history on file.  History  Substance Use Topics  . Smoking status: Current Every Day Smoker -- 0.5 packs/day    Types: Cigarettes  . Smokeless tobacco: Not on file  . Alcohol Use: Yes     Comment: rarely    OB History    Grav Para Term Preterm Abortions TAB SAB Ect Mult Living                  Review of Systems  Constitutional: Negative for fever, chills, activity change, appetite change and unexpected weight change.  HENT: Negative for sore throat, rhinorrhea, neck pain, neck stiffness and sinus pressure.   Eyes: Negative for visual disturbance.  Respiratory: Negative for cough and shortness of breath.   Cardiovascular: Negative for chest pain and leg swelling.  Gastrointestinal: Negative for vomiting, abdominal pain, diarrhea and blood in stool.  Genitourinary: Positive for flank pain. Negative for dysuria, urgency, frequency, vaginal discharge and  difficulty urinating.  Musculoskeletal: Negative for myalgias, arthralgias and gait problem.  Skin: Negative for color change and rash.  Neurological: Negative for weakness, light-headedness and headaches.  Hematological: Does not bruise/bleed easily.  Psychiatric/Behavioral: Negative for dysphoric mood.    Allergies  Phenergan  Home Medications   Current Outpatient Rx  Name  Route  Sig  Dispense  Refill  . IBUPROFEN 200 MG PO TABS   Oral   Take 600 mg by mouth every 6 (six) hours as needed. Pain           BP 131/84  Pulse 86  Temp 98.4 F (36.9 C) (Oral)  Resp 20  Ht 5\' 3"  (1.6 m)  Wt 178 lb (80.74 kg)  BMI 31.53 kg/m2  SpO2 100%  LMP 08/26/2012  Physical Exam  Nursing note and vitals reviewed. Constitutional: She appears well-developed and well-nourished.       Uncomfortable appearing.   HENT:  Head: Normocephalic and atraumatic.  Eyes: Conjunctivae normal and EOM are normal. Pupils are equal, round, and reactive to light.  Neck: Normal range of motion. Neck supple.  Cardiovascular: Normal rate, regular rhythm, normal heart sounds and intact distal pulses.   Pulmonary/Chest: Effort normal and breath sounds normal.  Abdominal: Soft. Bowel sounds are normal.  Musculoskeletal: Normal range of motion.  Neurological: She is alert.  Skin: Skin is warm and dry.  Psychiatric: She has a normal mood and affect. Thought content  normal.    ED Course  Procedures (including critical care time)   Labs Reviewed  URINALYSIS, ROUTINE W REFLEX MICROSCOPIC  PREGNANCY, URINE   No results found. Results for orders placed during the hospital encounter of 09/02/12  URINALYSIS, ROUTINE W REFLEX MICROSCOPIC      Component Value Range   Color, Urine YELLOW  YELLOW   APPearance HAZY (*) CLEAR   Specific Gravity, Urine >1.030 (*) 1.005 - 1.030   pH 5.5  5.0 - 8.0   Glucose, UA NEGATIVE  NEGATIVE mg/dL   Hgb urine dipstick LARGE (*) NEGATIVE   Bilirubin Urine NEGATIVE   NEGATIVE   Ketones, ur NEGATIVE  NEGATIVE mg/dL   Protein, ur NEGATIVE  NEGATIVE mg/dL   Urobilinogen, UA 0.2  0.0 - 1.0 mg/dL   Nitrite NEGATIVE  NEGATIVE   Leukocytes, UA NEGATIVE  NEGATIVE  PREGNANCY, URINE      Component Value Range   Preg Test, Ur NEGATIVE  NEGATIVE  URINE MICROSCOPIC-ADD ON      Component Value Range   Squamous Epithelial / LPF FEW (*) RARE   WBC, UA 3-6  <3 WBC/hpf   RBC / HPF 21-50  <3 RBC/hpf   Bacteria, UA RARE  RARE     No diagnosis found.  Ct Abdomen Pelvis Wo Contrast  09/02/2012  *RADIOLOGY REPORT*  Clinical Data: Left flank pain, hematuria  CT ABDOMEN AND PELVIS WITHOUT CONTRAST  Technique:  Multidetector CT imaging of the abdomen and pelvis was performed following the standard protocol without intravenous contrast. Sagittal and coronal MPR images reconstructed from axial data set.  Comparison: 02/27/2005  Findings: Lung bases clear. Gallbladder surgically absent. Left hydronephrosis and hydroureter secondary to a 2 mm diameter calculus at the left ureterovesicle junction. Bladder decompressed. No additional urinary tract calcification or right hydronephrosis.  Within limits of a nonenhanced exam, liver, spleen, pancreas, kidneys, and adrenal glands otherwise unremarkable. Normal appendix with unremarkable uterus and adnexae. Stomach and bowel loops normal appearance for technique. No mass, adenopathy, free fluid or inflammatory process. No acute osseous findings.  IMPRESSION: Left hydronephrosis and hydroureter secondary to a 2 mm left UPJ calculus.   Original Report Authenticated By: Ulyses Southward, M.D.     MDM  Patient with resolution of flank pain but now with some epigastric pain to mid chest.  Patient with kidney stone on left at uPJ.  Plan cxr and further pain meds.     CXR without acute changes.  Pain resolved.  Probable referred pain from ureteral stone.  Plan percocet, zofran, and keflex with urine culture.     Hilario Quarry, MD 09/02/12  [phone]

## 2012-09-04 LAB — URINE CULTURE

## 2013-02-08 ENCOUNTER — Encounter (HOSPITAL_COMMUNITY): Payer: Self-pay | Admitting: Emergency Medicine

## 2013-02-08 ENCOUNTER — Emergency Department (HOSPITAL_COMMUNITY)
Admission: EM | Admit: 2013-02-08 | Discharge: 2013-02-09 | Disposition: A | Discharge status: Home / Self Care | Payer: Self-pay | Attending: Emergency Medicine | Admitting: Emergency Medicine

## 2013-02-08 DIAGNOSIS — R350 Frequency of micturition: Secondary | ICD-10-CM | POA: Insufficient documentation

## 2013-02-08 DIAGNOSIS — F17200 Nicotine dependence, unspecified, uncomplicated: Secondary | ICD-10-CM | POA: Insufficient documentation

## 2013-02-08 DIAGNOSIS — R300 Dysuria: Secondary | ICD-10-CM | POA: Insufficient documentation

## 2013-02-08 DIAGNOSIS — R3915 Urgency of urination: Secondary | ICD-10-CM | POA: Insufficient documentation

## 2013-02-08 DIAGNOSIS — Z87442 Personal history of urinary calculi: Secondary | ICD-10-CM | POA: Insufficient documentation

## 2013-02-08 DIAGNOSIS — Z87448 Personal history of other diseases of urinary system: Secondary | ICD-10-CM | POA: Insufficient documentation

## 2013-02-08 DIAGNOSIS — N390 Urinary tract infection, site not specified: Secondary | ICD-10-CM | POA: Insufficient documentation

## 2013-02-08 HISTORY — DX: Renal tubulo-interstitial disease, unspecified: N15.9

## 2013-02-08 HISTORY — DX: Calculus of kidney: N20.0

## 2013-02-08 NOTE — ED Notes (Signed)
Patient complaining of lower abdominal pain, left flank pain, frequent urination, and burning on urination.

## 2013-02-09 ENCOUNTER — Emergency Department (HOSPITAL_COMMUNITY): Payer: Self-pay

## 2013-02-09 LAB — URINALYSIS, ROUTINE W REFLEX MICROSCOPIC
Nitrite: POSITIVE — AB
Protein, ur: >300 mg/dL — AB
Specific Gravity, Urine: 1.02 (ref 1.005–1.030)
Urobilinogen, UA: >8 mg/dL — ABNORMAL HIGH (ref 0.0–1.0)

## 2013-02-09 LAB — URINE MICROSCOPIC-ADD ON

## 2013-02-09 MED ORDER — ONDANSETRON HCL 4 MG/2ML IJ SOLN
4.0000 mg | Freq: Once | INTRAMUSCULAR | Status: AC
  Administered 2013-02-09: 4 mg via INTRAVENOUS
  Filled 2013-02-09: qty 2

## 2013-02-09 MED ORDER — CIPROFLOXACIN HCL 250 MG PO TABS
500.0000 mg | ORAL_TABLET | Freq: Once | ORAL | Status: AC
  Administered 2013-02-09: 500 mg via ORAL
  Filled 2013-02-09: qty 2

## 2013-02-09 MED ORDER — ONDANSETRON 4 MG PO TBDP: 4.0000 mg | ORAL_TABLET | Freq: Three times a day (TID) | ORAL | Status: DC | PRN

## 2013-02-09 MED ORDER — CIPROFLOXACIN HCL 500 MG PO TABS: 500.0000 mg | ORAL_TABLET | Freq: Two times a day (BID) | ORAL | Status: DC

## 2013-02-09 MED ORDER — HYDROMORPHONE HCL PF 1 MG/ML IJ SOLN
1.0000 mg | Freq: Once | INTRAMUSCULAR | Status: AC
  Administered 2013-02-09: 1 mg via INTRAVENOUS
  Filled 2013-02-09: qty 1

## 2013-02-09 NOTE — ED Provider Notes (Signed)
History     CSN: [id number]  Arrival date & time 02/08/13  2305   First MD Initiated Contact with Patient 02/08/13 2344      Chief Complaint  Patient presents with  . Urinary Frequency  . Abdominal Pain    (Consider location/radiation/quality/duration/timing/severity/associated sxs/prior treatment) HPI HPI Comments: [name] is a 27 y.o. female who presents to the Emergency Department complaining of left flank pain with radiation to the left groin associated with frequency, urgency and burning with urination. She has a h/o kidney stones and it feels the same. She had taken ibuprofen and hydrocodone which has eased her pain.   PCP Selinda Flavin  Past Medical History  Diagnosis Date  . Kidney stones   . Kidney infection     Past Surgical History  Procedure Laterality Date  . Tonsillectomy    . Anterior cruciate ligament repair  2005  . Cholecystectomy      History reviewed. No pertinent family history.  History  Substance Use Topics  . Smoking status: Current Every Day Smoker -- 0.50 packs/day    Types: Cigarettes  . Smokeless tobacco: Not on file  . Alcohol Use: Yes     Comment: rarely    OB History   Grav Para Term Preterm Abortions TAB SAB Ect Mult Living                  Review of Systems  Constitutional: Negative for fever.       10 Systems reviewed and are negative for acute change except as noted in the HPI.  HENT: Negative for congestion.   Eyes: Negative for discharge and redness.  Respiratory: Negative for cough and shortness of breath.   Cardiovascular: Negative for chest pain.  Gastrointestinal: Positive for abdominal pain. Negative for vomiting.  Genitourinary: Positive for urgency, frequency and flank pain.  Musculoskeletal: Negative for back pain.  Skin: Negative for rash.  Neurological: Negative for syncope, numbness and headaches.  Psychiatric/Behavioral:       No behavior change.    Allergies  Phenergan  Home Medications    Current Outpatient Rx  Name  Route  Sig  Dispense  Refill  . cephALEXin (KEFLEX) 500 MG capsule   Oral   Take 1 capsule (500 mg total) by mouth 4 (four) times daily.   28 capsule   0   . ibuprofen (ADVIL,MOTRIN) 200 MG tablet   Oral   Take 600 mg by mouth every 6 (six) hours as needed. Pain         . ondansetron (ZOFRAN ODT) 8 MG disintegrating tablet   Oral   Take 1 tablet (8 mg total) by mouth every 8 (eight) hours as needed for nausea.   20 tablet   0   . oxyCODONE-acetaminophen (PERCOCET/ROXICET) 5-325 MG per tablet   Oral   Take 1 tablet by mouth every 4 (four) hours as needed for pain.   15 tablet   0     BP 131/81  Pulse 75  Temp(Src) 98.6 F (37 C) (Oral)  Resp 18  Ht 5\' 3"  (1.6 m)  Wt 162 lb (73.483 kg)  BMI 28.7 kg/m2  SpO2 100%  LMP 02/05/2013  Physical Exam  Nursing note and vitals reviewed. Constitutional: She appears well-developed and well-nourished.  Awake, alert, nontoxic appearance.  HENT:  Head: Normocephalic and atraumatic.  Eyes: EOM are normal. Pupils are equal, round, and reactive to light. Right eye exhibits no discharge. Left eye exhibits no discharge.  Neck:  Normal range of motion. Neck supple.  Cardiovascular: Normal rate and intact distal pulses.   Pulmonary/Chest: Effort normal and breath sounds normal. She exhibits no tenderness.  Abdominal: Soft. Bowel sounds are normal. There is no tenderness. There is no rebound.  Genitourinary:  No cva tenderenss  Musculoskeletal: She exhibits no tenderness.  Baseline ROM, no obvious new focal weakness.  Neurological:  Mental status and motor strength appears baseline for patient and situation.  Skin: No rash noted.  Psychiatric: She has a normal mood and affect.    ED Course  Procedures (including critical care time) Results for orders placed during the hospital encounter of 09/02/12  URINE CULTURE      Result Value Range   Specimen Description URINE, CATHETERIZED     Special  Requests NONE     Culture  Setup Time 09/03/2012 21:06     Colony Count >=100,000 COLONIES/ML     Culture       Value: Multiple bacterial morphotypes present, none predominant. Suggest appropriate recollection if clinically indicated.   Report Status 09/04/2012 FINAL    URINALYSIS, ROUTINE W REFLEX MICROSCOPIC      Result Value Range   Color, Urine YELLOW  YELLOW   APPearance HAZY (*) CLEAR   Specific Gravity, Urine >1.030 (*) 1.005 - 1.030   pH 5.5  5.0 - 8.0   Glucose, UA NEGATIVE  NEGATIVE mg/dL   Hgb urine dipstick LARGE (*) NEGATIVE   Bilirubin Urine NEGATIVE  NEGATIVE   Ketones, ur NEGATIVE  NEGATIVE mg/dL   Protein, ur NEGATIVE  NEGATIVE mg/dL   Urobilinogen, UA 0.2  0.0 - 1.0 mg/dL   Nitrite NEGATIVE  NEGATIVE   Leukocytes, UA NEGATIVE  NEGATIVE  PREGNANCY, URINE      Result Value Range   Preg Test, Ur NEGATIVE  NEGATIVE  URINE MICROSCOPIC-ADD ON      Result Value Range   Squamous Epithelial / LPF FEW (*) RARE   WBC, UA 3-6  <3 WBC/hpf   RBC / HPF 21-50  <3 RBC/hpf   Bacteria, UA RARE  RARE   Ct Abdomen Pelvis Wo Contrast  02/09/2013   *RADIOLOGY REPORT*  Clinical Data: Bilateral flank pain and hematuria.  CT ABDOMEN AND PELVIS WITHOUT CONTRAST  Technique:  Multidetector CT imaging of the abdomen and pelvis was performed following the standard protocol without intravenous contrast.  Comparison: 09/02/2012.  Findings: The lung bases are clear.  The solid abdominal organs are grossly normal without IV contrast. No renal or obstructing ureteral calculi.  The gallbladder surgically absent.  No common bile duct dilatation.  The stomach, duodenum, small bowel and colon are grossly normal without oral contrast.  The appendix is normal.  No mesenteric or retroperitoneal mass or adenopathy.  Small scattered lymph nodes are stable.  The aorta is normal in caliber.  The uterus and ovaries are unremarkable.  The bladder is normal. No pelvic mass, adenopathy or free fluid collections.  No  inguinal mass or hernia.  The bony structures are unremarkable.  IMPRESSION:  Unremarkable noncontrast CT examination of the abdomen/pelvis.  No renal or obstructing ureteral calculi.   Original Report Authenticated By: Rudie Meyer, M.D.       MDM  Patient with a h/o kidney stones here with left flank pain with radiation to the left lower abdomen. CT negative for stones. UA with hgb. Most likely is an early UTI. Will treat with antibiotics. Reviewed results with patient. Pt stable in ED with no significant deterioration in condition.The  patient appears reasonably screened and/or stabilized for discharge and I doubt any other medical condition or other [facility] requiring further screening, evaluation, or treatment in the ED at this time prior to discharge.  MDM Reviewed: nursing note and vitals Interpretation: CT scan and labs           [facility]. Colon Branch, MD 02/09/13 0200

## 2013-02-09 NOTE — ED Notes (Signed)
Patient complaining of worsening pain. Advised MD. Verbal order for dilaudid 1mg , and zofran 4mg  IV.

## 2013-02-10 LAB — URINE CULTURE: Colony Count: 40000

## 2013-02-11 ENCOUNTER — Telehealth (HOSPITAL_COMMUNITY): Payer: Self-pay | Admitting: Emergency Medicine

## 2013-02-11 NOTE — ED Notes (Signed)
Post ED Visit - Positive Culture Follow-up  Culture report reviewed by antimicrobial stewardship pharmacist: []  Marlou Sa, Pharm.D., BCPS [x]  Celedonio Miyamoto, Pharm.D., BCPS []  Georgina Pillion, Pharm.D., BCPS []  [place], [address].D., BCPS, AAHIVP []  Estella Husk, Pharm.D., BCPS, AAHIV  Positive urine culture Treated with Cipro, organism sensitive to the same and no further patient follow-up is required at this time.  Kylie A Holland 02/11/2013, 11:16 AM

## 2013-04-26 ENCOUNTER — Emergency Department (HOSPITAL_COMMUNITY)
Admission: EM | Admit: 2013-04-26 | Discharge: 2013-04-26 | Disposition: A | Discharge status: Home / Self Care | Payer: Self-pay | Attending: Emergency Medicine | Admitting: Emergency Medicine

## 2013-04-26 ENCOUNTER — Encounter (HOSPITAL_COMMUNITY): Payer: Self-pay | Admitting: Emergency Medicine

## 2013-04-26 DIAGNOSIS — K029 Dental caries, unspecified: Secondary | ICD-10-CM | POA: Insufficient documentation

## 2013-04-26 DIAGNOSIS — Z87448 Personal history of other diseases of urinary system: Secondary | ICD-10-CM | POA: Insufficient documentation

## 2013-04-26 DIAGNOSIS — K0889 Other specified disorders of teeth and supporting structures: Secondary | ICD-10-CM

## 2013-04-26 DIAGNOSIS — R51 Headache: Secondary | ICD-10-CM | POA: Insufficient documentation

## 2013-04-26 DIAGNOSIS — F17200 Nicotine dependence, unspecified, uncomplicated: Secondary | ICD-10-CM | POA: Insufficient documentation

## 2013-04-26 DIAGNOSIS — Z87442 Personal history of urinary calculi: Secondary | ICD-10-CM | POA: Insufficient documentation

## 2013-04-26 DIAGNOSIS — K089 Disorder of teeth and supporting structures, unspecified: Secondary | ICD-10-CM | POA: Insufficient documentation

## 2013-04-26 MED ORDER — AMOXICILLIN 500 MG PO CAPS: 500.0000 mg | ORAL_CAPSULE | Freq: Three times a day (TID) | ORAL | Status: DC

## 2013-04-26 MED ORDER — HYDROCODONE-ACETAMINOPHEN 5-325 MG PO TABS: ORAL_TABLET | ORAL | Status: DC

## 2013-04-26 NOTE — ED Provider Notes (Signed)
CSN: [id number]     Arrival date & time 04/26/13  2045 History     First MD Initiated Contact with Patient 04/26/13 2102     Chief Complaint  Patient presents with  . Dental Pain   (Consider location/radiation/quality/duration/timing/severity/associated sxs/prior Treatment) Patient is a 27 y.o. female presenting with tooth pain. The history is provided by the patient.  Dental Pain Location:  Lower Lower teeth location:  32/RL 3rd molar Quality:  Throbbing and sharp Severity:  Moderate Onset quality:  Gradual Duration:  1 day Timing:  Constant Progression:  Unchanged Chronicity:  New Context: dental caries, filling fell out and poor dentition   Context: not recent dental surgery and not trauma   Relieved by:  Nothing Worsened by:  Hot food/drink and cold food/drink Ineffective treatments:  NSAIDs Associated symptoms: facial pain   Associated symptoms: no congestion, no difficulty swallowing, no drooling, no facial swelling, no fever, no gum swelling, no headaches, no neck pain, no neck swelling, no oral bleeding, no oral lesions and no trismus   Risk factors: lack of dental care and smoking   Risk factors: no diabetes     Past Medical History  Diagnosis Date  . Kidney stones   . Kidney infection    Past Surgical History  Procedure Laterality Date  . Tonsillectomy    . Anterior cruciate ligament repair  2005  . Cholecystectomy     History reviewed. No pertinent family history. History  Substance Use Topics  . Smoking status: Current Every Day Smoker -- 0.50 packs/day    Types: Cigarettes  . Smokeless tobacco: Not on file  . Alcohol Use: Yes     Comment: rarely   OB History   Grav Para Term Preterm Abortions TAB SAB Ect Mult Living                 Review of Systems  Constitutional: Negative for fever and appetite change.  HENT: Positive for dental problem. Negative for congestion, sore throat, facial swelling, drooling, mouth sores, trouble swallowing, neck  pain and neck stiffness.   Eyes: Negative for pain and visual disturbance.  Neurological: Negative for dizziness, facial asymmetry and headaches.  Hematological: Negative for adenopathy.  All other systems reviewed and are negative.    Allergies  Phenergan  Home Medications   Current Outpatient Rx  Name  Route  Sig  Dispense  Refill  . cephALEXin (KEFLEX) 500 MG capsule   Oral   Take 1 capsule (500 mg total) by mouth 4 (four) times daily.   28 capsule   0   . ciprofloxacin (CIPRO) 500 MG tablet   Oral   Take 1 tablet (500 mg total) by mouth 2 (two) times daily.   14 tablet   0   . ibuprofen (ADVIL,MOTRIN) 200 MG tablet   Oral   Take 600 mg by mouth every 6 (six) hours as needed. Pain         . ondansetron (ZOFRAN ODT) 4 MG disintegrating tablet   Oral   Take 1 tablet (4 mg total) by mouth every 8 (eight) hours as needed for nausea.   20 tablet   0   . ondansetron (ZOFRAN ODT) 8 MG disintegrating tablet   Oral   Take 1 tablet (8 mg total) by mouth every 8 (eight) hours as needed for nausea.   20 tablet   0   . oxyCODONE-acetaminophen (PERCOCET/ROXICET) 5-325 MG per tablet   Oral   Take 1 tablet by mouth every  4 (four) hours as needed for pain.   15 tablet   0    BP 125/73  Pulse 70  Temp(Src) 98.4 F (36.9 C) (Oral)  Resp 20  Ht 5\' 3"  (1.6 m)  Wt 160 lb (72.576 kg)  BMI 28.35 kg/m2  SpO2 100%  LMP 04/08/2013 Physical Exam  Nursing note and vitals reviewed. Constitutional: She is oriented to person, place, and time. She appears well-developed and well-nourished. No distress.  HENT:  Head: Normocephalic and atraumatic. No trismus in the jaw.  Right Ear: Tympanic membrane and ear canal normal.  Left Ear: Tympanic membrane and ear canal normal.  Mouth/Throat: Uvula is midline, oropharynx is clear and moist and mucous membranes are normal. Dental caries present. No dental abscesses or edematous.    ttp of the right lower third molar.  Dental decay is  present.  No facial edema, abscess or trismus.    Neck: Normal range of motion. Neck supple.  Cardiovascular: Normal rate, regular rhythm and normal heart sounds.   No murmur heard. Pulmonary/Chest: Effort normal and breath sounds normal. No respiratory distress.  Musculoskeletal: Normal range of motion.  Lymphadenopathy:    She has no cervical adenopathy.  Neurological: She is alert and oriented to person, place, and time. She exhibits normal muscle tone. Coordination normal.  Skin: Skin is warm and dry.    ED Course   Procedures (including critical care time)  Labs Reviewed - No data to display No results found. No diagnosis found.  MDM    VSS.  Patient is non-toxic appearing.  Stable for discharge.  No concerning sx's for Ludwig Angina.  Pt agrees to f/u with a dentist, referral info given  Burgundy Matuszak L. Layton Naves, PA-C 04/29/13 1256

## 2013-04-26 NOTE — ED Notes (Signed)
Patient is complaining of lower right side dental pain since yesterday.

## 2013-04-30 NOTE — ED Provider Notes (Signed)
Medical screening examination/treatment/procedure(s) were performed by non-physician practitioner and as supervising physician I was immediately available for consultation/collaboration.  Nilay Mangrum R. Chinelo Benn, MD 04/30/13 1211 

## 2013-09-20 NOTE — L&D Delivery Note (Signed)
Delivery Note  PRE-OPERATIVE DIAGNOSIS:  1) [redacted]w[redacted]d pregnancy.   POST-OPERATIVE DIAGNOSIS:  1) [redacted]w[redacted]d pregnancy s/p Vaginal, Spontaneous Delivery   Delivery Type: Vaginal, Spontaneous Delivery   Delivery Clinician: Kedra Mcglade   Delivery Anesthesia: Epidural   Labor Complications: None  Lacerations: None   ESTIMATED BLOOD LOSS: 250    Labor course: This is a 28 y.o. y.o. female [id number]  who came in at [redacted]w[redacted]d pregnancy complaining of contractions.  Her prenatal course was complicated by Rh neg status, rubella NI, tobacco abuse, and palpitations.  Initial cervical exam was 4/30/-1.  She was admitted to L and D.  Labor course included:  AROM   Procedure: Vaginal, Spontaneous Delivery    Date of birth: 06/11/2012   Time of birth: 1:48 AM    This [id number] woman under epidural anesthesia delivered a viable female  infant weighing Weight: 7 lb 3.2 oz (3.266 kg) (Filed from Delivery Summary)  with Apgars as listed below.  Delivery was via NSVD  Delivery completed and cord cut and clamped. Infant dried and stimulated. Infant to maternal abdomen. Cord pH not obtained. Active management of the third stage of labor performed. Intact placenta delivered spontaneously at 9/22  1:56 AM . Vagina and cervix explored and no lacerations noted. Uterus well contracted at end of delivery.  Mother and infant tolerated delivery well.   FINDINGS:   1) female infant, Apgar scores of 7    at 1 minute 9    at 5 minutes   2) 3 Vessel Cord  3) Nuchal: no  SPECIMENS: Placenta discarded; Cord gases not obtained  COMPLICATIONS: None  DISPOSITION:  Infant to NBN

## 2013-09-20 NOTE — L&D Delivery Note (Signed)
Attestation of Attending Supervision of Obstetric Fellow: Evaluation and management procedures were performed by the Obstetric Fellow under my supervision and collaboration.  I have reviewed the Obstetric Fellow's note and chart, and I agree with the management and plan.  Joliyah Lippens, DO Attending Physician Faculty Practice, [facility]  

## 2013-10-09 ENCOUNTER — Ambulatory Visit (INDEPENDENT_AMBULATORY_CARE_PROVIDER_SITE_OTHER): Payer: Medicaid Other | Admitting: Obstetrics and Gynecology

## 2013-10-09 VITALS — BP 130/68 | Ht 64.0 in | Wt 181.6 lb

## 2013-10-09 DIAGNOSIS — Z3201 Encounter for pregnancy test, result positive: Secondary | ICD-10-CM

## 2013-10-09 LAB — POCT URINE PREGNANCY: Preg Test, Ur: POSITIVE

## 2013-10-09 NOTE — Progress Notes (Signed)
Patient ID: Vanessa ChinMegan S Mings, female   DOB: Sep 11, 1986, 28 y.o.   MRN: [id number] Pt here today for a pregnancy test, resulted positive.  Was told to return in 1 - 2 weeks for US and new OB appt.

## 2013-10-22 ENCOUNTER — Other Ambulatory Visit: Payer: Self-pay | Admitting: Obstetrics & Gynecology

## 2013-10-22 DIAGNOSIS — O3680X0 Pregnancy with inconclusive fetal viability, not applicable or unspecified: Secondary | ICD-10-CM

## 2013-10-23 ENCOUNTER — Encounter: Payer: Medicaid Other | Admitting: Women's Health

## 2013-10-23 ENCOUNTER — Ambulatory Visit (INDEPENDENT_AMBULATORY_CARE_PROVIDER_SITE_OTHER): Payer: Medicaid Other

## 2013-10-23 DIAGNOSIS — O3680X0 Pregnancy with inconclusive fetal viability, not applicable or unspecified: Secondary | ICD-10-CM

## 2013-10-23 NOTE — Progress Notes (Signed)
U/S(7+5wks)-single IUP with +FCA noted, FHR-159 bpm, cx appears closed, bilateral adnexa wnl, CRL c/w LMP dates

## 2013-10-31 ENCOUNTER — Encounter: Payer: Self-pay | Admitting: Advanced Practice Midwife

## 2013-10-31 ENCOUNTER — Ambulatory Visit (INDEPENDENT_AMBULATORY_CARE_PROVIDER_SITE_OTHER): Payer: Medicaid Other | Admitting: Advanced Practice Midwife

## 2013-10-31 VITALS — BP 110/60 | Wt 182.0 lb

## 2013-10-31 DIAGNOSIS — Z6791 Unspecified blood type, Rh negative: Secondary | ICD-10-CM | POA: Insufficient documentation

## 2013-10-31 DIAGNOSIS — Z331 Pregnant state, incidental: Secondary | ICD-10-CM

## 2013-10-31 DIAGNOSIS — O09299 Supervision of pregnancy with other poor reproductive or obstetric history, unspecified trimester: Secondary | ICD-10-CM

## 2013-10-31 DIAGNOSIS — Z1389 Encounter for screening for other disorder: Secondary | ICD-10-CM

## 2013-10-31 DIAGNOSIS — Z3480 Encounter for supervision of other normal pregnancy, unspecified trimester: Secondary | ICD-10-CM

## 2013-10-31 DIAGNOSIS — O26899 Other specified pregnancy related conditions, unspecified trimester: Secondary | ICD-10-CM

## 2013-10-31 DIAGNOSIS — O360990 Maternal care for other rhesus isoimmunization, unspecified trimester, not applicable or unspecified: Secondary | ICD-10-CM

## 2013-10-31 DIAGNOSIS — Z3490 Encounter for supervision of normal pregnancy, unspecified, unspecified trimester: Secondary | ICD-10-CM

## 2013-10-31 LAB — CBC
HCT: 39.6 % (ref 36.0–46.0)
HEMOGLOBIN: 13.7 g/dL (ref 12.0–15.0)
MCH: 30.5 pg (ref 26.0–34.0)
MCHC: 34.6 g/dL (ref 30.0–36.0)
MCV: 88.2 fL (ref 78.0–100.0)
Platelets: 262 10*3/uL (ref 150–400)
RBC: 4.49 MIL/uL (ref 3.87–5.11)
RDW: 13.4 % (ref 11.5–15.5)
WBC: 12.8 10*3/uL — AB (ref 4.0–10.5)

## 2013-10-31 LAB — POCT URINALYSIS DIPSTICK
GLUCOSE UA: NEGATIVE
Ketones, UA: NEGATIVE
Leukocytes, UA: NEGATIVE
NITRITE UA: NEGATIVE
PROTEIN UA: NEGATIVE

## 2013-10-31 NOTE — Progress Notes (Signed)
  Subjective:    [name] is a [id number] 4040w6d being seen today for her first obstetrical visit.  Her obstetrical history is significant for smoker. She is down to  4-5 cigs/day and wants to quit.  Is using e cig.  Pregnancy history fully reviewed.  Patient reports nausea.  Filed Vitals:   10/31/13 1111  BP: 110/60  Weight: 182 lb (82.555 kg)    HISTORY: OB History  Gravida Para Term Preterm AB SAB TAB Ectopic Multiple Living  7 2 2  0 4 4 0 0 0 2    # Outcome Date GA Lbr Len/2nd Weight Sex Delivery Anes PTL Lv  7 CUR           6 SAB 2014          5 TRM 04/13/09 5680w2d  8 lb 6 oz (3.799 kg) F  EPI Y Y     Comments: elective IOL  4 SAB 2009          3 SAB 2006          2 SAB 2006          1 TRM 10/08/96 8080w2d  7 lb 5 oz (3.317 kg) F SVD EPI N Y     Comments: IOL elective     Past Medical History  Diagnosis Date  . Kidney stones   . Kidney infection    Past Surgical History  Procedure Laterality Date  . Tonsillectomy    . Anterior cruciate ligament repair  2005  . Cholecystectomy     Family History  Problem Relation Age of Onset  . Cancer Neg Hx   . Heart disease Neg Hx   . Hyperlipidemia Neg Hx   . Kidney disease Neg Hx   . Hypertension Neg Hx   . Stroke Neg Hx      Exam        Last Pap 4/14: normal per pt                              System:     Skin: normal coloration and turgor, no rashes    Neurologic: oriented, normal, normal mood   Extremities: normal strength, tone, and muscle mass   HEENT PERRLA   Mouth/Teeth mucous membranes moist, pharynx normal without lesions   Neck supple and no masses   Cardiovascular: regular rate and rhythm   Respiratory:  appears well, vitals normal, no respiratory distress, acyanotic, normal RR   Abdomen: soft, non-tender; bowel sounds normal; no masses,  no organomegaly          Assessment:    Pregnancy: [id number] Patient Active Problem List   Diagnosis Date Noted  . Pregnant 10/31/2013  . Rh negative  state in antepartum period 10/31/2013        Plan:     Initial labs drawn. Prenatal vitamins. Problem list reviewed and updated. Genetic Screening discussed Integrated Screen: requested.  Ultrasound discussed; fetal survey: requested.  Follow up in 4 weeks for NT/IT/Low-risk ob appt Diclegis and B Calm samples.  Will call for rx if wanted.  CRESENZO-DISHMAN,Jonathen Rathman 10/31/2013

## 2013-11-01 LAB — RUBELLA SCREEN: Rubella: 0.77 Index (ref <0.90)

## 2013-11-01 LAB — OXYCODONE SCREEN, UA, RFLX CONFIRM: Oxycodone Screen, Ur: NEGATIVE ng/mL

## 2013-11-01 LAB — DRUG SCREEN, URINE, NO CONFIRMATION
Amphetamine Screen, Ur: NEGATIVE
Barbiturate Quant, Ur: NEGATIVE
Benzodiazepines.: NEGATIVE
COCAINE METABOLITES: NEGATIVE
CREATININE, U: 107.2 mg/dL
Marijuana Metabolite: NEGATIVE
Methadone: NEGATIVE
OPIATE SCREEN, URINE: NEGATIVE
PHENCYCLIDINE (PCP): NEGATIVE
Propoxyphene: NEGATIVE

## 2013-11-01 LAB — VARICELLA ZOSTER ANTIBODY, IGG: Varicella IgG: 1388 Index — ABNORMAL HIGH (ref <135.00)

## 2013-11-01 LAB — CYSTIC FIBROSIS DIAGNOSTIC STUDY

## 2013-11-01 LAB — ABO AND RH: RH TYPE: NEGATIVE

## 2013-11-01 LAB — GC/CHLAMYDIA PROBE AMP, URINE
Chlamydia, Swab/Urine, PCR: NEGATIVE
GC PROBE AMP, URINE: NEGATIVE

## 2013-11-01 LAB — HIV ANTIBODY (ROUTINE TESTING W REFLEX): HIV: NONREACTIVE

## 2013-11-01 LAB — HEPATITIS B SURFACE ANTIGEN: Hepatitis B Surface Ag: NEGATIVE

## 2013-11-01 LAB — ANTIBODY SCREEN: Antibody Screen: NEGATIVE

## 2013-11-01 LAB — RPR

## 2013-11-02 LAB — URINE CULTURE

## 2013-11-09 ENCOUNTER — Telehealth: Payer: Self-pay | Admitting: Adult Health

## 2013-11-09 NOTE — Telephone Encounter (Signed)
Number disconnected @ 8:48 am. JSY

## 2013-11-12 NOTE — Telephone Encounter (Signed)
Number disconnected. Encounter closed. JSY

## 2013-11-14 ENCOUNTER — Telehealth: Payer: Self-pay | Admitting: *Deleted

## 2013-11-14 MED ORDER — DOXYLAMINE-PYRIDOXINE 10-10 MG PO TBEC: 10.0000 mg | DELAYED_RELEASE_TABLET | ORAL | Status: DC

## 2013-11-14 NOTE — Telephone Encounter (Signed)
Pt is out of Diclegis and would like some [place] into [facility] [place]. Unable to talk to pt because number is disconnected. JSY

## 2013-11-14 NOTE — Telephone Encounter (Signed)
Pt's number disconnected. No other number available. Need to speak to pt when she calls. JSY

## 2013-11-16 NOTE — Telephone Encounter (Signed)
Number still says disconnected even though pt states it's not. Encounter closed. JSY

## 2013-11-28 ENCOUNTER — Encounter: Payer: Self-pay | Admitting: Women's Health

## 2013-11-28 ENCOUNTER — Ambulatory Visit (INDEPENDENT_AMBULATORY_CARE_PROVIDER_SITE_OTHER): Payer: Medicaid Other | Admitting: Women's Health

## 2013-11-28 ENCOUNTER — Encounter (INDEPENDENT_AMBULATORY_CARE_PROVIDER_SITE_OTHER): Payer: Self-pay

## 2013-11-28 ENCOUNTER — Ambulatory Visit (INDEPENDENT_AMBULATORY_CARE_PROVIDER_SITE_OTHER): Payer: Medicaid Other

## 2013-11-28 ENCOUNTER — Other Ambulatory Visit: Payer: Self-pay | Admitting: Advanced Practice Midwife

## 2013-11-28 VITALS — BP 120/62 | Wt 183.0 lb

## 2013-11-28 DIAGNOSIS — Z3480 Encounter for supervision of other normal pregnancy, unspecified trimester: Secondary | ICD-10-CM

## 2013-11-28 DIAGNOSIS — Z1389 Encounter for screening for other disorder: Secondary | ICD-10-CM

## 2013-11-28 DIAGNOSIS — O360990 Maternal care for other rhesus isoimmunization, unspecified trimester, not applicable or unspecified: Secondary | ICD-10-CM

## 2013-11-28 DIAGNOSIS — Z331 Pregnant state, incidental: Secondary | ICD-10-CM

## 2013-11-28 DIAGNOSIS — Z36 Encounter for antenatal screening of mother: Secondary | ICD-10-CM

## 2013-11-28 DIAGNOSIS — O09299 Supervision of pregnancy with other poor reproductive or obstetric history, unspecified trimester: Secondary | ICD-10-CM

## 2013-11-28 DIAGNOSIS — Z3490 Encounter for supervision of normal pregnancy, unspecified, unspecified trimester: Secondary | ICD-10-CM

## 2013-11-28 DIAGNOSIS — Z23 Encounter for immunization: Secondary | ICD-10-CM

## 2013-11-28 LAB — POCT URINALYSIS DIPSTICK
Glucose, UA: NEGATIVE
KETONES UA: NEGATIVE
Leukocytes, UA: NEGATIVE
Nitrite, UA: NEGATIVE
PROTEIN UA: NEGATIVE

## 2013-11-28 MED ORDER — INFLUENZA VAC SPLIT QUAD 0.5 ML IM SUSP
0.5000 mL | Freq: Once | INTRAMUSCULAR | Status: AC
  Administered 2013-11-28: 0.5 mL via INTRAMUSCULAR

## 2013-11-28 NOTE — Patient Instructions (Signed)
Second Trimester of Pregnancy The second trimester is from week 13 through week 28, months 4 through 6. The second trimester is often a time when you feel your best. Your body has also adjusted to being pregnant, and you begin to feel better physically. Usually, morning sickness has lessened or quit completely, you may have more energy, and you may have an increase in appetite. The second trimester is also a time when the fetus is growing rapidly. At the end of the sixth month, the fetus is about 9 inches long and weighs about 1 pounds. You will likely begin to feel the baby move (quickening) between 18 and 20 weeks of the pregnancy. BODY CHANGES Your body goes through many changes during pregnancy. The changes vary from woman to woman.   Your weight will continue to increase. You will notice your lower abdomen bulging out.  You may begin to get stretch marks on your hips, abdomen, and breasts.  You may develop headaches that can be relieved by medicines approved by your caregiver.  You may urinate more often because the fetus is pressing on your bladder.  You may develop or continue to have heartburn as a result of your pregnancy.  You may develop constipation because certain hormones are causing the muscles that push waste through your intestines to slow down.  You may develop hemorrhoids or swollen, bulging veins (varicose veins).  You may have back pain because of the weight gain and pregnancy hormones relaxing your joints between the bones in your pelvis and as a result of a shift in weight and the muscles that support your balance.  Your breasts will continue to grow and be tender.  Your gums may bleed and may be sensitive to brushing and flossing.  Dark spots or blotches (chloasma, mask of pregnancy) may develop on your face. This will likely fade after the baby is born.  A dark line from your belly button to the pubic area (linea nigra) may appear. This will likely fade after the  baby is born. WHAT TO EXPECT AT YOUR PRENATAL VISITS During a routine prenatal visit:  You will be weighed to make sure you and the fetus are growing normally.  Your blood pressure will be taken.  Your abdomen will be measured to track your baby's growth.  The fetal heartbeat will be listened to.  Any test results from the previous visit will be discussed. Your caregiver may ask you:  How you are feeling.  If you are feeling the baby move.  If you have had any abnormal symptoms, such as leaking fluid, bleeding, severe headaches, or abdominal cramping.  If you have any questions. Other tests that may be performed during your second trimester include:  Blood tests that check for:  Low iron levels (anemia).  Gestational diabetes (between 24 and 28 weeks).  Rh antibodies.  Urine tests to check for infections, diabetes, or protein in the urine.  An ultrasound to confirm the proper growth and development of the baby.  An amniocentesis to check for possible genetic problems.  Fetal screens for spina bifida and Down syndrome. HOME CARE INSTRUCTIONS   Avoid all smoking, herbs, alcohol, and unprescribed drugs. These chemicals affect the formation and growth of the baby.  Follow your caregiver's instructions regarding medicine use. There are medicines that are either safe or unsafe to take during pregnancy.  Exercise only as directed by your caregiver. Experiencing uterine cramps is a good sign to stop exercising.  Continue to eat regular,   healthy meals.  Wear a good support bra for breast tenderness.  Do not use hot tubs, steam rooms, or saunas.  Wear your seat belt at all times when driving.  Avoid raw meat, uncooked cheese, cat litter boxes, and soil used by cats. These carry germs that can cause birth defects in the baby.  Take your prenatal vitamins.  Try taking a stool softener (if your caregiver approves) if you develop constipation. Eat more high-fiber foods,  such as fresh vegetables or fruit and whole grains. Drink plenty of fluids to keep your urine clear or pale yellow.  Take warm sitz baths to soothe any pain or discomfort caused by hemorrhoids. Use hemorrhoid cream if your caregiver approves.  If you develop varicose veins, wear support hose. Elevate your feet for 15 minutes, 3 4 times a day. Limit salt in your diet.  Avoid heavy lifting, wear low heel shoes, and practice good posture.  Rest with your legs elevated if you have leg cramps or low back pain.  Visit your dentist if you have not gone yet during your pregnancy. Use a soft toothbrush to brush your teeth and be gentle when you floss.  A sexual relationship may be continued unless your caregiver directs you otherwise.  Continue to go to all your prenatal visits as directed by your caregiver. SEEK MEDICAL CARE IF:   You have dizziness.  You have mild pelvic cramps, pelvic pressure, or nagging pain in the abdominal area.  You have persistent nausea, vomiting, or diarrhea.  You have a bad smelling vaginal discharge.  You have pain with urination. SEEK IMMEDIATE MEDICAL CARE IF:   You have a fever.  You are leaking fluid from your vagina.  You have spotting or bleeding from your vagina.  You have severe abdominal cramping or pain.  You have rapid weight gain or loss.  You have shortness of breath with chest pain.  You notice sudden or extreme swelling of your face, hands, ankles, feet, or legs.  You have not felt your baby move in over an hour.  You have severe headaches that do not go away with medicine.  You have vision changes. Document Released: 08/31/2001 Document Revised: 05/09/2013 Document Reviewed: 11/07/2012 [employer] Patient Information 2014 [employer].  

## 2013-11-28 NOTE — Progress Notes (Signed)
U/S(12+6wks)-single IUP with +FCA noted, FHR- 160 bpm, CRL c/w dates, cx appears closed,bilateral adnexa wnl, NB present, NT-1.6361mm, anterior gr 0 placenta noted

## 2013-11-28 NOTE — Progress Notes (Signed)
Denies cramping, lof, vb, uti s/s.  Continues to have occ episodes of sob w/ rapid HR. Has h/o panic attacks, doesn't feel like these are panic attacks. No CP, hand tingling. Drinks lots of caffeine daily. Trying to cut back. HRRR, LCTAB, recommended decreasing caffeine. If still happening/not getting any better, let us know. Reviewed warning s/s to report.  All questions answered. F/U in 4wks for 2nd IT and visit. 1st IT/NT today.

## 2013-12-04 LAB — MATERNAL SCREEN, INTEGRATED #1

## 2013-12-26 ENCOUNTER — Ambulatory Visit (INDEPENDENT_AMBULATORY_CARE_PROVIDER_SITE_OTHER): Payer: Medicaid Other | Admitting: Women's Health

## 2013-12-26 ENCOUNTER — Encounter: Payer: Self-pay | Admitting: Women's Health

## 2013-12-26 VITALS — BP 122/70 | Wt 184.0 lb

## 2013-12-26 DIAGNOSIS — Z1389 Encounter for screening for other disorder: Secondary | ICD-10-CM

## 2013-12-26 DIAGNOSIS — Z6791 Unspecified blood type, Rh negative: Secondary | ICD-10-CM

## 2013-12-26 DIAGNOSIS — Z331 Pregnant state, incidental: Secondary | ICD-10-CM

## 2013-12-26 DIAGNOSIS — O26899 Other specified pregnancy related conditions, unspecified trimester: Secondary | ICD-10-CM

## 2013-12-26 DIAGNOSIS — O360990 Maternal care for other rhesus isoimmunization, unspecified trimester, not applicable or unspecified: Secondary | ICD-10-CM

## 2013-12-26 DIAGNOSIS — O99330 Smoking (tobacco) complicating pregnancy, unspecified trimester: Secondary | ICD-10-CM

## 2013-12-26 DIAGNOSIS — R0602 Shortness of breath: Secondary | ICD-10-CM

## 2013-12-26 DIAGNOSIS — Z3480 Encounter for supervision of other normal pregnancy, unspecified trimester: Secondary | ICD-10-CM

## 2013-12-26 DIAGNOSIS — R002 Palpitations: Secondary | ICD-10-CM

## 2013-12-26 DIAGNOSIS — O09299 Supervision of pregnancy with other poor reproductive or obstetric history, unspecified trimester: Secondary | ICD-10-CM

## 2013-12-26 DIAGNOSIS — F17200 Nicotine dependence, unspecified, uncomplicated: Secondary | ICD-10-CM | POA: Insufficient documentation

## 2013-12-26 LAB — POCT URINALYSIS DIPSTICK
Glucose, UA: NEGATIVE
Ketones, UA: NEGATIVE
Nitrite, UA: NEGATIVE
Protein, UA: NEGATIVE

## 2013-12-26 NOTE — Patient Instructions (Signed)
Second Trimester of Pregnancy The second trimester is from week 13 through week 28, months 4 through 6. The second trimester is often a time when you feel your best. Your body has also adjusted to being pregnant, and you begin to feel better physically. Usually, morning sickness has lessened or quit completely, you may have more energy, and you may have an increase in appetite. The second trimester is also a time when the fetus is growing rapidly. At the end of the sixth month, the fetus is about 9 inches long and weighs about 1 pounds. You will likely begin to feel the baby move (quickening) between 18 and 20 weeks of the pregnancy. BODY CHANGES Your body goes through many changes during pregnancy. The changes vary from woman to woman.   Your weight will continue to increase. You will notice your lower abdomen bulging out.  You may begin to get stretch marks on your hips, abdomen, and breasts.  You may develop headaches that can be relieved by medicines approved by your caregiver.  You may urinate more often because the fetus is pressing on your bladder.  You may develop or continue to have heartburn as a result of your pregnancy.  You may develop constipation because certain hormones are causing the muscles that push waste through your intestines to slow down.  You may develop hemorrhoids or swollen, bulging veins (varicose veins).  You may have back pain because of the weight gain and pregnancy hormones relaxing your joints between the bones in your pelvis and as a result of a shift in weight and the muscles that support your balance.  Your breasts will continue to grow and be tender.  Your gums may bleed and may be sensitive to brushing and flossing.  Dark spots or blotches (chloasma, mask of pregnancy) may develop on your face. This will likely fade after the baby is born.  A dark line from your belly button to the pubic area (linea nigra) may appear. This will likely fade after the  baby is born. WHAT TO EXPECT AT YOUR PRENATAL VISITS During a routine prenatal visit:  You will be weighed to make sure you and the fetus are growing normally.  Your blood pressure will be taken.  Your abdomen will be measured to track your baby's growth.  The fetal heartbeat will be listened to.  Any test results from the previous visit will be discussed. Your caregiver may ask you:  How you are feeling.  If you are feeling the baby move.  If you have had any abnormal symptoms, such as leaking fluid, bleeding, severe headaches, or abdominal cramping.  If you have any questions. Other tests that may be performed during your second trimester include:  Blood tests that check for:  Low iron levels (anemia).  Gestational diabetes (between 24 and 28 weeks).  Rh antibodies.  Urine tests to check for infections, diabetes, or protein in the urine.  An ultrasound to confirm the proper growth and development of the baby.  An amniocentesis to check for possible genetic problems.  Fetal screens for spina bifida and Down syndrome. HOME CARE INSTRUCTIONS   Avoid all smoking, herbs, alcohol, and unprescribed drugs. These chemicals affect the formation and growth of the baby.  Follow your caregiver's instructions regarding medicine use. There are medicines that are either safe or unsafe to take during pregnancy.  Exercise only as directed by your caregiver. Experiencing uterine cramps is a good sign to stop exercising.  Continue to eat regular,   healthy meals.  Wear a good support bra for breast tenderness.  Do not use hot tubs, steam rooms, or saunas.  Wear your seat belt at all times when driving.  Avoid raw meat, uncooked cheese, cat litter boxes, and soil used by cats. These carry germs that can cause birth defects in the baby.  Take your prenatal vitamins.  Try taking a stool softener (if your caregiver approves) if you develop constipation. Eat more high-fiber foods,  such as fresh vegetables or fruit and whole grains. Drink plenty of fluids to keep your urine clear or pale yellow.  Take warm sitz baths to soothe any pain or discomfort caused by hemorrhoids. Use hemorrhoid cream if your caregiver approves.  If you develop varicose veins, wear support hose. Elevate your feet for 15 minutes, 3 4 times a day. Limit salt in your diet.  Avoid heavy lifting, wear low heel shoes, and practice good posture.  Rest with your legs elevated if you have leg cramps or low back pain.  Visit your dentist if you have not gone yet during your pregnancy. Use a soft toothbrush to brush your teeth and be gentle when you floss.  A sexual relationship may be continued unless your caregiver directs you otherwise.  Continue to go to all your prenatal visits as directed by your caregiver. SEEK MEDICAL CARE IF:   You have dizziness.  You have mild pelvic cramps, pelvic pressure, or nagging pain in the abdominal area.  You have persistent nausea, vomiting, or diarrhea.  You have a bad smelling vaginal discharge.  You have pain with urination. SEEK IMMEDIATE MEDICAL CARE IF:   You have a fever.  You are leaking fluid from your vagina.  You have spotting or bleeding from your vagina.  You have severe abdominal cramping or pain.  You have rapid weight gain or loss.  You have shortness of breath with chest pain.  You notice sudden or extreme swelling of your face, hands, ankles, feet, or legs.  You have not felt your baby move in over an hour.  You have severe headaches that do not go away with medicine.  You have vision changes. Document Released: 08/31/2001 Document Revised: 05/09/2013 Document Reviewed: 11/07/2012 [employer] Patient Information 2014 [employer].  

## 2013-12-26 NOTE — Progress Notes (Signed)
Thinks she's starting to feel some movement. Denies cramping, lof, vb, uti s/s.  Has cut back drastically on caffeine, down to 1 coke/day, still having episodes of sob and rapid heart rate. Happens randomly, can be while sitting, or w/ exertion. Lasts 5-377mins, happens about 2-10x/day. H/o panic attacks, but absolutely does not feel like panic attacks. No h/o asthma/lung/heart problems.  HRRR, LCTAB.  48hr Holter monitor scheduled tomorrow at 0800 at [facility]. RLS and leg cramps- recommended increasing fe-rich foods and magnesium supplement. Still smoking, unable to quit, but really wants to. [facility] referral placed. Reviewed warning s/s to report.  All questions answered. F/U in 4wks for anatomy u/s and visit.

## 2013-12-27 ENCOUNTER — Ambulatory Visit (HOSPITAL_COMMUNITY)
Admission: RE | Admit: 2013-12-27 | Discharge: 2013-12-27 | Disposition: A | Discharge status: Home / Self Care | Payer: Medicaid Other | Source: Ambulatory Visit | Attending: Family Medicine | Admitting: Family Medicine

## 2013-12-27 DIAGNOSIS — R002 Palpitations: Secondary | ICD-10-CM

## 2013-12-27 DIAGNOSIS — R0602 Shortness of breath: Secondary | ICD-10-CM

## 2013-12-27 NOTE — Progress Notes (Signed)
48 hour holter monitor in progress.  

## 2013-12-29 LAB — MATERNAL SCREEN, INTEGRATED #2
AFP MoM: 0.91
AFP, SERUM MAT SCREEN: 30.7 ng/mL
Age risk Down Syndrome: 1:990 {titer}
CROWN RUMP LENGTH MAT SCREEN 2: 72.8 mm
Calculated Gestational Age: 17.3
ESTRIOL MOM MAT SCREEN: 0.72
Estriol, Free: 0.76 ng/mL
HCG, SERUM MAT SCREEN: 14.5 [IU]/mL
Inhibin A Dimeric: 252 pg/mL
Inhibin A MoM: 1.68
MSS Down Syndrome: <1:5000 {titer}
NT MOM MAT SCREEN: 1
NUCHAL TRANSLUCENCY MAT SCREEN 2: 1.61 mm
NUMBER OF FETUSES MAT SCREEN 2: 1
PAPP-A MOM MAT SCREEN: 0.51
PAPP-A: 496 ng/mL
Rish for ONTD: <1:5000 {titer}
hCG MoM: 0.6

## 2013-12-31 ENCOUNTER — Other Ambulatory Visit: Payer: Self-pay | Admitting: *Deleted

## 2013-12-31 DIAGNOSIS — R0602 Shortness of breath: Secondary | ICD-10-CM

## 2013-12-31 DIAGNOSIS — R002 Palpitations: Secondary | ICD-10-CM

## 2014-01-01 ENCOUNTER — Encounter: Payer: Self-pay | Admitting: Women's Health

## 2014-01-16 ENCOUNTER — Encounter: Payer: Self-pay | Admitting: Women's Health

## 2014-01-16 DIAGNOSIS — R002 Palpitations: Secondary | ICD-10-CM | POA: Insufficient documentation

## 2014-01-18 ENCOUNTER — Telehealth: Payer: Self-pay

## 2014-01-21 NOTE — Telephone Encounter (Signed)
Pt having oral surgery, Thursday, May 7,2015, taking tylenol #3. Pt states concerned about taking a class C antibiotic while being pregnant. Please advise.

## 2014-01-23 ENCOUNTER — Ambulatory Visit (INDEPENDENT_AMBULATORY_CARE_PROVIDER_SITE_OTHER): Payer: Medicaid Other | Admitting: Women's Health

## 2014-01-23 ENCOUNTER — Other Ambulatory Visit: Payer: Self-pay | Admitting: Women's Health

## 2014-01-23 ENCOUNTER — Encounter: Payer: Self-pay | Admitting: Women's Health

## 2014-01-23 ENCOUNTER — Ambulatory Visit (INDEPENDENT_AMBULATORY_CARE_PROVIDER_SITE_OTHER): Payer: Medicaid Other

## 2014-01-23 VITALS — BP 116/62 | Wt 190.5 lb

## 2014-01-23 DIAGNOSIS — O360990 Maternal care for other rhesus isoimmunization, unspecified trimester, not applicable or unspecified: Secondary | ICD-10-CM

## 2014-01-23 DIAGNOSIS — O09299 Supervision of pregnancy with other poor reproductive or obstetric history, unspecified trimester: Secondary | ICD-10-CM

## 2014-01-23 DIAGNOSIS — Z3480 Encounter for supervision of other normal pregnancy, unspecified trimester: Secondary | ICD-10-CM

## 2014-01-23 DIAGNOSIS — O2620 Pregnancy care for patient with recurrent pregnancy loss, unspecified trimester: Secondary | ICD-10-CM

## 2014-01-23 DIAGNOSIS — O99330 Smoking (tobacco) complicating pregnancy, unspecified trimester: Secondary | ICD-10-CM

## 2014-01-23 DIAGNOSIS — O26892 Other specified pregnancy related conditions, second trimester: Secondary | ICD-10-CM

## 2014-01-23 DIAGNOSIS — O2390 Unspecified genitourinary tract infection in pregnancy, unspecified trimester: Secondary | ICD-10-CM

## 2014-01-23 DIAGNOSIS — Z1389 Encounter for screening for other disorder: Secondary | ICD-10-CM

## 2014-01-23 DIAGNOSIS — Z331 Pregnant state, incidental: Secondary | ICD-10-CM

## 2014-01-23 DIAGNOSIS — Z131 Encounter for screening for diabetes mellitus: Secondary | ICD-10-CM

## 2014-01-23 DIAGNOSIS — R81 Glycosuria: Secondary | ICD-10-CM

## 2014-01-23 DIAGNOSIS — N898 Other specified noninflammatory disorders of vagina: Secondary | ICD-10-CM

## 2014-01-23 DIAGNOSIS — F17200 Nicotine dependence, unspecified, uncomplicated: Secondary | ICD-10-CM

## 2014-01-23 LAB — POCT URINALYSIS DIPSTICK
Ketones, UA: NEGATIVE
Nitrite, UA: NEGATIVE
PROTEIN UA: NEGATIVE

## 2014-01-23 LAB — POCT WET PREP (WET MOUNT): Clue Cells Wet Prep Whiff POC: NEGATIVE

## 2014-01-23 LAB — GLUCOSE, POCT (MANUAL RESULT ENTRY): POC Glucose: 92 mg/dl (ref 70–99)

## 2014-01-23 MED ORDER — FLUCONAZOLE 150 MG PO TABS: 150.0000 mg | ORAL_TABLET | Freq: Once | ORAL | Status: DC

## 2014-01-23 NOTE — Progress Notes (Signed)
U/S(20+6wks)-active fetus, meas c/w dates, fluid wnl, anterior Gr 0 placenta, cx appears closed (4.1cm), bilateral adnexa wnl, FHR-149 bpm, no major abnl noted, female fetus

## 2014-01-23 NOTE — Progress Notes (Signed)
Reports good fm. Denies uc's, lof, vb, uti s/s.  Having oral surgery tomorrow, removing wisdom & 3 other teeth, 1 has been abscessed and she has been on amoxicillin, believes she has a yeast infection. Itching and thick white d/c. Spec exam: large amount thick white nonodorous d/c, wet prep + yeast. Rx diflucan.  Still smoking 4-5 cig/day, states quitlinenc never contacted her- so will fax referral again today, as she does want to quit. Reviewed normal holter monitor results- states she did have sob/tachycardic episodes while wearing. Offered referral to cardiology- declines at this time.  Reviewed anatomy u/s, ptl s/s, fm.  All questions answered. F/U in 4wks for visit.

## 2014-01-23 NOTE — Patient Instructions (Signed)
Second Trimester of Pregnancy The second trimester is from week 13 through week 28, months 4 through 6. The second trimester is often a time when you feel your best. Your body has also adjusted to being pregnant, and you begin to feel better physically. Usually, morning sickness has lessened or quit completely, you may have more energy, and you may have an increase in appetite. The second trimester is also a time when the fetus is growing rapidly. At the end of the sixth month, the fetus is about 9 inches long and weighs about 1 pounds. You will likely begin to feel the baby move (quickening) between 18 and 20 weeks of the pregnancy. BODY CHANGES Your body goes through many changes during pregnancy. The changes vary from woman to woman.   Your weight will continue to increase. You will notice your lower abdomen bulging out.  You may begin to get stretch marks on your hips, abdomen, and breasts.  You may develop headaches that can be relieved by medicines approved by your caregiver.  You may urinate more often because the fetus is pressing on your bladder.  You may develop or continue to have heartburn as a result of your pregnancy.  You may develop constipation because certain hormones are causing the muscles that push waste through your intestines to slow down.  You may develop hemorrhoids or swollen, bulging veins (varicose veins).  You may have back pain because of the weight gain and pregnancy hormones relaxing your joints between the bones in your pelvis and as a result of a shift in weight and the muscles that support your balance.  Your breasts will continue to grow and be tender.  Your gums may bleed and may be sensitive to brushing and flossing.  Dark spots or blotches (chloasma, mask of pregnancy) may develop on your face. This will likely fade after the baby is born.  A dark line from your belly button to the pubic area (linea nigra) may appear. This will likely fade after the  baby is born. WHAT TO EXPECT AT YOUR PRENATAL VISITS During a routine prenatal visit:  You will be weighed to make sure you and the fetus are growing normally.  Your blood pressure will be taken.  Your abdomen will be measured to track your baby's growth.  The fetal heartbeat will be listened to.  Any test results from the previous visit will be discussed. Your caregiver may ask you:  How you are feeling.  If you are feeling the baby move.  If you have had any abnormal symptoms, such as leaking fluid, bleeding, severe headaches, or abdominal cramping.  If you have any questions. Other tests that may be performed during your second trimester include:  Blood tests that check for:  Low iron levels (anemia).  Gestational diabetes (between 24 and 28 weeks).  Rh antibodies.  Urine tests to check for infections, diabetes, or protein in the urine.  An ultrasound to confirm the proper growth and development of the baby.  An amniocentesis to check for possible genetic problems.  Fetal screens for spina bifida and Down syndrome. HOME CARE INSTRUCTIONS   Avoid all smoking, herbs, alcohol, and unprescribed drugs. These chemicals affect the formation and growth of the baby.  Follow your caregiver's instructions regarding medicine use. There are medicines that are either safe or unsafe to take during pregnancy.  Exercise only as directed by your caregiver. Experiencing uterine cramps is a good sign to stop exercising.  Continue to eat regular,   healthy meals.  Wear a good support bra for breast tenderness.  Do not use hot tubs, steam rooms, or saunas.  Wear your seat belt at all times when driving.  Avoid raw meat, uncooked cheese, cat litter boxes, and soil used by cats. These carry germs that can cause birth defects in the baby.  Take your prenatal vitamins.  Try taking a stool softener (if your caregiver approves) if you develop constipation. Eat more high-fiber foods,  such as fresh vegetables or fruit and whole grains. Drink plenty of fluids to keep your urine clear or pale yellow.  Take warm sitz baths to soothe any pain or discomfort caused by hemorrhoids. Use hemorrhoid cream if your caregiver approves.  If you develop varicose veins, wear support hose. Elevate your feet for 15 minutes, 3 4 times a day. Limit salt in your diet.  Avoid heavy lifting, wear low heel shoes, and practice good posture.  Rest with your legs elevated if you have leg cramps or low back pain.  Visit your dentist if you have not gone yet during your pregnancy. Use a soft toothbrush to brush your teeth and be gentle when you floss.  A sexual relationship may be continued unless your caregiver directs you otherwise.  Continue to go to all your prenatal visits as directed by your caregiver. SEEK MEDICAL CARE IF:   You have dizziness.  You have mild pelvic cramps, pelvic pressure, or nagging pain in the abdominal area.  You have persistent nausea, vomiting, or diarrhea.  You have a bad smelling vaginal discharge.  You have pain with urination. SEEK IMMEDIATE MEDICAL CARE IF:   You have a fever.  You are leaking fluid from your vagina.  You have spotting or bleeding from your vagina.  You have severe abdominal cramping or pain.  You have rapid weight gain or loss.  You have shortness of breath with chest pain.  You notice sudden or extreme swelling of your face, hands, ankles, feet, or legs.  You have not felt your baby move in over an hour.  You have severe headaches that do not go away with medicine.  You have vision changes. Document Released: 08/31/2001 Document Revised: 05/09/2013 Document Reviewed: 11/07/2012 [employer] Patient Information 2014 [employer].  

## 2014-02-20 ENCOUNTER — Ambulatory Visit (INDEPENDENT_AMBULATORY_CARE_PROVIDER_SITE_OTHER): Payer: Self-pay | Admitting: Women's Health

## 2014-02-20 ENCOUNTER — Encounter: Payer: Self-pay | Admitting: Women's Health

## 2014-02-20 VITALS — BP 120/70 | Wt 190.0 lb

## 2014-02-20 DIAGNOSIS — Z331 Pregnant state, incidental: Secondary | ICD-10-CM

## 2014-02-20 DIAGNOSIS — Z3480 Encounter for supervision of other normal pregnancy, unspecified trimester: Secondary | ICD-10-CM

## 2014-02-20 DIAGNOSIS — Z1389 Encounter for screening for other disorder: Secondary | ICD-10-CM

## 2014-02-20 LAB — POCT URINALYSIS DIPSTICK
GLUCOSE UA: NEGATIVE
Ketones, UA: NEGATIVE
Nitrite, UA: NEGATIVE
PROTEIN UA: NEGATIVE

## 2014-02-20 NOTE — Progress Notes (Signed)
Reports good fm. Denies lof, vb, uti s/s, abnormal/malodorous d/c or vulvovaginal itching/irritation.  ~4 BH/day, some uncomfortable, states she had ptl w/ both of her other children around this time. Spec exam: cx visually closed, small amt creamy white nonodorous d/c, fFN collected. SVE: LTC, fFN not sent d/t cx. Recommended increasing po fluids, emptying bladder frequently, calling us/going to [facility] if feels like anything is changing. Reviewed ptl s/s, fm.  All questions answered. F/U in 4wks for pn2 and visit.

## 2014-02-20 NOTE — Patient Instructions (Signed)
You will have your sugar test next visit.  Please do not eat or drink anything after midnight the night before you come, not even water.  You will be here for at least two hours.     Preterm Labor Information Preterm labor is when labor starts at less than 37 weeks of pregnancy. The normal length of a pregnancy is 39 to 41 weeks. CAUSES Often, there is no identifiable underlying cause as to why a woman goes into preterm labor. One of the most common known causes of preterm labor is infection. Infections of the uterus, cervix, vagina, amniotic sac, bladder, kidney, or even the lungs (pneumonia) can cause labor to start. Other suspected causes of preterm labor include:   Urogenital infections, such as yeast infections and bacterial vaginosis.   Uterine abnormalities (uterine shape, uterine septum, fibroids, or bleeding from the placenta).   A cervix that has been operated on (it may fail to stay closed).   Malformations in the fetus.   Multiple gestations (twins, triplets, and so on).   Breakage of the amniotic sac.  RISK FACTORS  Having a previous history of preterm labor.   Having premature rupture of membranes (PROM).   Having a placenta that covers the opening of the cervix (placenta previa).   Having a placenta that separates from the uterus (placental abruption).   Having a cervix that is too weak to hold the fetus in the uterus (incompetent cervix).   Having too much fluid in the amniotic sac (polyhydramnios).   Taking illegal drugs or smoking while pregnant.   Not gaining enough weight while pregnant.   Being younger than 56 and older than 26 years old.   Having a low socioeconomic status.   Being African American. SYMPTOMS Signs and symptoms of preterm labor include:   Menstrual-like cramps, abdominal pain, or back pain.  Uterine contractions that are regular, as frequent as six in an hour, regardless of their intensity (may be mild or  painful).  Contractions that start on the top of the uterus and spread down to the lower abdomen and back.   A sense of increased pelvic pressure.   A watery or bloody mucus discharge that comes from the vagina.  TREATMENT Depending on the length of the pregnancy and other circumstances, your health care provider may suggest bed rest. If necessary, there are medicines that can be given to stop contractions and to mature the fetal lungs. If labor happens before 34 weeks of pregnancy, a prolonged hospital stay may be recommended. Treatment depends on the condition of both you and the fetus.  WHAT SHOULD YOU DO IF YOU THINK YOU ARE IN PRETERM LABOR? Call your health care provider right away. You will need to go to the hospital to get checked immediately. HOW CAN YOU PREVENT PRETERM LABOR IN FUTURE PREGNANCIES? You should:   Stop smoking if you smoke.  Maintain healthy weight gain and avoid chemicals and drugs that are not necessary.  Be watchful for any type of infection.  Inform your health care provider if you have a known history of preterm labor. Document Released: 11/27/2003 Document Revised: 05/09/2013 Document Reviewed: 10/09/2012 [facility] Patient Information 2014 [place], [place].

## 2014-03-20 ENCOUNTER — Encounter: Payer: Self-pay | Admitting: Women's Health

## 2014-03-20 ENCOUNTER — Ambulatory Visit (INDEPENDENT_AMBULATORY_CARE_PROVIDER_SITE_OTHER): Payer: Self-pay | Admitting: Women's Health

## 2014-03-20 ENCOUNTER — Other Ambulatory Visit: Payer: Medicaid Other

## 2014-03-20 VITALS — BP 118/60 | Wt 190.0 lb

## 2014-03-20 DIAGNOSIS — Z331 Pregnant state, incidental: Secondary | ICD-10-CM

## 2014-03-20 DIAGNOSIS — Z3480 Encounter for supervision of other normal pregnancy, unspecified trimester: Secondary | ICD-10-CM

## 2014-03-20 DIAGNOSIS — Z3482 Encounter for supervision of other normal pregnancy, second trimester: Secondary | ICD-10-CM

## 2014-03-20 DIAGNOSIS — Z1389 Encounter for screening for other disorder: Secondary | ICD-10-CM

## 2014-03-20 LAB — CBC
HEMATOCRIT: 32.3 % — AB (ref 36.0–46.0)
HEMOGLOBIN: 11.3 g/dL — AB (ref 12.0–15.0)
MCH: 30.9 pg (ref 26.0–34.0)
MCHC: 35 g/dL (ref 30.0–36.0)
MCV: 88.3 fL (ref 78.0–100.0)
Platelets: 229 10*3/uL (ref 150–400)
RBC: 3.66 MIL/uL — ABNORMAL LOW (ref 3.87–5.11)
RDW: 13.1 % (ref 11.5–15.5)
WBC: 14.5 10*3/uL — AB (ref 4.0–10.5)

## 2014-03-20 LAB — POCT URINALYSIS DIPSTICK
Blood, UA: NEGATIVE
Glucose, UA: NEGATIVE
KETONES UA: NEGATIVE
Leukocytes, UA: NEGATIVE
Nitrite, UA: NEGATIVE
Protein, UA: NEGATIVE

## 2014-03-20 NOTE — Patient Instructions (Signed)
Tdap vaccine at 28 weeks at health department or your family doctor, recommended for you and anyone who will be around the baby a lot   Third Trimester of Pregnancy The third trimester is from week 29 through week 42, months 7 through 9. The third trimester is a time when the fetus is growing rapidly. At the end of the ninth month, the fetus is about 20 inches in length and weighs 6-10 pounds.  BODY CHANGES Your body goes through many changes during pregnancy. The changes vary from woman to woman.   Your weight will continue to increase. You can expect to gain 25-35 pounds (11-16 kg) by the end of the pregnancy.  You may begin to get stretch marks on your hips, abdomen, and breasts.  You may urinate more often because the fetus is moving lower into your pelvis and pressing on your bladder.  You may develop or continue to have heartburn as a result of your pregnancy.  You may develop constipation because certain hormones are causing the muscles that push waste through your intestines to slow down.  You may develop hemorrhoids or swollen, bulging veins (varicose veins).  You may have pelvic pain because of the weight gain and pregnancy hormones relaxing your joints between the bones in your pelvis. Backaches may result from overexertion of the muscles supporting your posture.  You may have changes in your hair. These can include thickening of your hair, rapid growth, and changes in texture. Some women also have hair loss during or after pregnancy, or hair that feels dry or thin. Your hair will most likely return to normal after your baby is born.  Your breasts will continue to grow and be tender. A yellow discharge may leak from your breasts called colostrum.  Your belly button may stick out.  You may feel short of breath because of your expanding uterus.  You may notice the fetus "dropping," or moving lower in your abdomen.  You may have a bloody mucus discharge. This usually occurs a  few days to a week before labor begins.  Your cervix becomes thin and soft (effaced) near your due date. WHAT TO EXPECT AT YOUR PRENATAL EXAMS  You will have prenatal exams every 2 weeks until week 36. Then, you will have weekly prenatal exams. During a routine prenatal visit:  You will be weighed to make sure you and the fetus are growing normally.  Your blood pressure is taken.  Your abdomen will be measured to track your baby's growth.  The fetal heartbeat will be listened to.  Any test results from the previous visit will be discussed.  You may have a cervical check near your due date to see if you have effaced. At around 36 weeks, your caregiver will check your cervix. At the same time, your caregiver will also perform a test on the secretions of the vaginal tissue. This test is to determine if a type of bacteria, Group B streptococcus, is present. Your caregiver will explain this further. Your caregiver may ask you:  What your birth plan is.  How you are feeling.  If you are feeling the baby move.  If you have had any abnormal symptoms, such as leaking fluid, bleeding, severe headaches, or abdominal cramping.  If you have any questions. Other tests or screenings that may be performed during your third trimester include:  Blood tests that check for low iron levels (anemia).  Fetal testing to check the health, activity level, and growth of the   fetus. Testing is done if you have certain medical conditions or if there are problems during the pregnancy. FALSE LABOR You may feel small, irregular contractions that eventually go away. These are called Braxton Hicks contractions, or false labor. Contractions may last for hours, days, or even weeks before true labor sets in. If contractions come at regular intervals, intensify, or become painful, it is best to be seen by your caregiver.  SIGNS OF LABOR   Menstrual-like cramps.  Contractions that are 5 minutes apart or  less.  Contractions that start on the top of the uterus and spread down to the lower abdomen and back.  A sense of increased pelvic pressure or back pain.  A watery or bloody mucus discharge that comes from the vagina. If you have any of these signs before the 37th week of pregnancy, call your caregiver right away. You need to go to the hospital to get checked immediately. HOME CARE INSTRUCTIONS   Avoid all smoking, herbs, alcohol, and unprescribed drugs. These chemicals affect the formation and growth of the baby.  Follow your caregiver's instructions regarding medicine use. There are medicines that are either safe or unsafe to take during pregnancy.  Exercise only as directed by your caregiver. Experiencing uterine cramps is a good sign to stop exercising.  Continue to eat regular, healthy meals.  Wear a good support bra for breast tenderness.  Do not use hot tubs, steam rooms, or saunas.  Wear your seat belt at all times when driving.  Avoid raw meat, uncooked cheese, cat litter boxes, and soil used by cats. These carry germs that can cause birth defects in the baby.  Take your prenatal vitamins.  Try taking a stool softener (if your caregiver approves) if you develop constipation. Eat more high-fiber foods, such as fresh vegetables or fruit and whole grains. Drink plenty of fluids to keep your urine clear or pale yellow.  Take warm sitz baths to soothe any pain or discomfort caused by hemorrhoids. Use hemorrhoid cream if your caregiver approves.  If you develop varicose veins, wear support hose. Elevate your feet for 15 minutes, 3-4 times a day. Limit salt in your diet.  Avoid heavy lifting, wear low heal shoes, and practice good posture.  Rest a lot with your legs elevated if you have leg cramps or low back pain.  Visit your dentist if you have not gone during your pregnancy. Use a soft toothbrush to brush your teeth and be gentle when you floss.  A sexual relationship  may be continued unless your caregiver directs you otherwise.  Do not travel far distances unless it is absolutely necessary and only with the approval of your caregiver.  Take prenatal classes to understand, practice, and ask questions about the labor and delivery.  Make a trial run to the hospital.  Pack your hospital bag.  Prepare the baby's nursery.  Continue to go to all your prenatal visits as directed by your caregiver. SEEK MEDICAL CARE IF:  You are unsure if you are in labor or if your water has broken.  You have dizziness.  You have mild pelvic cramps, pelvic pressure, or nagging pain in your abdominal area.  You have persistent nausea, vomiting, or diarrhea.  You have a bad smelling vaginal discharge.  You have pain with urination. SEEK IMMEDIATE MEDICAL CARE IF:   You have a fever.  You are leaking fluid from your vagina.  You have spotting or bleeding from your vagina.  You have severe abdominal   cramping or pain.  You have rapid weight loss or gain.  You have shortness of breath with chest pain.  You notice sudden or extreme swelling of your face, hands, ankles, feet, or legs.  You have not felt your baby move in over an hour.  You have severe headaches that do not go away with medicine.  You have vision changes. Document Released: 08/31/2001 Document Revised: 09/11/2013 Document Reviewed: 11/07/2012 [employer] Patient Information 2015 [employer]. This information is not intended to replace advice given to you by your health care provider. Make sure you discuss any questions you have with your health care provider.  

## 2014-03-20 NOTE — Progress Notes (Addendum)
Low-risk OB appointment [id number] Estimated Date of Delivery: 06/06/14 BP 118/60  Wt 190 lb (86.183 kg)  LMP 08/30/2013  BP, weight, and urine reviewed.  Refer to obstetrical flow sheet for FH & FHR.  Reports good fm.  Denies regular uc's, lof, vb, or uti s/s. ~10 BH/day, +LBP at times feels like back labor. H/O PTL w/ other 2 pregnancies w/ full-term births.  Spec exam: cx visually closed, small amount creamy white nonodorous d/c, SVE: LTC Reviewed ptl s/s, fkc. Increase po fluids, empty bladder frequently. Maternity belt. Recommended tdap Plan:  Continue routine obstetrical care  F/U in 4wks for OB appointment  PN2 today

## 2014-03-20 NOTE — Progress Notes (Signed)
Pt states that she has been having some lower back/side pain that comes and goes, mostly in the evenings but sometimes during the day.

## 2014-03-21 LAB — GLUCOSE TOLERANCE, 2 HOURS W/ 1HR
GLUCOSE, 2 HOUR: 125 mg/dL (ref 70–139)
GLUCOSE, FASTING: 73 mg/dL (ref 70–99)
Glucose, 1 hour: 141 mg/dL (ref 70–170)

## 2014-03-21 LAB — HIV ANTIBODY (ROUTINE TESTING W REFLEX): HIV 1&2 Ab, 4th Generation: NONREACTIVE

## 2014-03-21 LAB — RPR

## 2014-03-21 LAB — ANTIBODY SCREEN: ANTIBODY SCREEN: NEGATIVE

## 2014-03-23 ENCOUNTER — Encounter: Payer: Self-pay | Admitting: Women's Health

## 2014-03-26 LAB — HSV 2 ANTIBODY, IGG

## 2014-03-28 ENCOUNTER — Encounter (HOSPITAL_COMMUNITY): Payer: Self-pay | Admitting: Emergency Medicine

## 2014-03-28 ENCOUNTER — Emergency Department (HOSPITAL_COMMUNITY)
Admission: EM | Admit: 2014-03-28 | Discharge: 2014-03-28 | Disposition: A | Discharge status: Home / Self Care | Payer: Medicaid Other | Attending: Emergency Medicine | Admitting: Emergency Medicine

## 2014-03-28 DIAGNOSIS — R42 Dizziness and giddiness: Secondary | ICD-10-CM | POA: Diagnosis not present

## 2014-03-28 DIAGNOSIS — Z87442 Personal history of urinary calculi: Secondary | ICD-10-CM | POA: Insufficient documentation

## 2014-03-28 DIAGNOSIS — Z8619 Personal history of other infectious and parasitic diseases: Secondary | ICD-10-CM | POA: Diagnosis not present

## 2014-03-28 DIAGNOSIS — O99330 Smoking (tobacco) complicating pregnancy, unspecified trimester: Secondary | ICD-10-CM | POA: Insufficient documentation

## 2014-03-28 DIAGNOSIS — Z79899 Other long term (current) drug therapy: Secondary | ICD-10-CM | POA: Insufficient documentation

## 2014-03-28 DIAGNOSIS — Z8659 Personal history of other mental and behavioral disorders: Secondary | ICD-10-CM | POA: Insufficient documentation

## 2014-03-28 DIAGNOSIS — Z3490 Encounter for supervision of normal pregnancy, unspecified, unspecified trimester: Secondary | ICD-10-CM

## 2014-03-28 DIAGNOSIS — M545 Low back pain, unspecified: Secondary | ICD-10-CM | POA: Diagnosis not present

## 2014-03-28 DIAGNOSIS — R1110 Vomiting, unspecified: Secondary | ICD-10-CM | POA: Diagnosis not present

## 2014-03-28 DIAGNOSIS — O9989 Other specified diseases and conditions complicating pregnancy, childbirth and the puerperium: Secondary | ICD-10-CM | POA: Insufficient documentation

## 2014-03-28 DIAGNOSIS — R0602 Shortness of breath: Secondary | ICD-10-CM | POA: Insufficient documentation

## 2014-03-28 HISTORY — DX: Depression, unspecified: F32.A

## 2014-03-28 HISTORY — DX: Post-traumatic stress disorder, unspecified: F43.10

## 2014-03-28 HISTORY — DX: Major depressive disorder, single episode, unspecified: F32.9

## 2014-03-28 HISTORY — DX: Anxiety disorder, unspecified: F41.9
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
## 2014-03-28 NOTE — ED Notes (Signed)
FHT 156
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
## 2014-03-28 NOTE — Discharge Instructions (Signed)
Follow up with your obstetrician. Return here for any problems

## 2014-04-17 ENCOUNTER — Ambulatory Visit (INDEPENDENT_AMBULATORY_CARE_PROVIDER_SITE_OTHER): Payer: Medicaid Other | Admitting: Advanced Practice Midwife

## 2014-04-17 VITALS — BP 116/74 | Wt 193.0 lb

## 2014-04-17 DIAGNOSIS — O360990 Maternal care for other rhesus isoimmunization, unspecified trimester, not applicable or unspecified: Secondary | ICD-10-CM

## 2014-04-17 DIAGNOSIS — Z331 Pregnant state, incidental: Secondary | ICD-10-CM

## 2014-04-17 DIAGNOSIS — Z1389 Encounter for screening for other disorder: Secondary | ICD-10-CM

## 2014-04-17 LAB — POCT URINALYSIS DIPSTICK
Glucose, UA: NEGATIVE
Ketones, UA: NEGATIVE
Leukocytes, UA: NEGATIVE
Nitrite, UA: NEGATIVE
PROTEIN UA: NEGATIVE
RBC UA: NEGATIVE

## 2014-04-17 MED ORDER — RHO D IMMUNE GLOBULIN 1500 UNIT/2ML IJ SOSY
300.0000 ug | PREFILLED_SYRINGE | Freq: Once | INTRAMUSCULAR | Status: AC
  Administered 2014-04-17: 300 ug via INTRAMUSCULAR

## 2014-04-17 NOTE — Progress Notes (Signed)
[id number] Estimated Date of Delivery: 06/06/14  Blood pressure 116/74, weight 193 lb (87.544 kg), last menstrual period 08/30/2013.   BP weight and urine results all reviewed and noted.  Please refer to the obstetrical flow sheet for the fundal height and fetal heart rate documentation:  Patient reports good fetal movement, denies any bleeding and no rupture of membranes symptoms or regular contractions. Patient is without complaints. All questions were answered.  Plan:  Continued routine obstetrical care, Rhogam today  Follow up in 2 weeks for OB appointment,

## 2014-05-01 ENCOUNTER — Ambulatory Visit (INDEPENDENT_AMBULATORY_CARE_PROVIDER_SITE_OTHER): Payer: Self-pay | Admitting: Advanced Practice Midwife

## 2014-05-01 ENCOUNTER — Encounter: Payer: Self-pay | Admitting: Advanced Practice Midwife

## 2014-05-01 VITALS — BP 118/80 | Wt 192.0 lb

## 2014-05-01 DIAGNOSIS — Z3480 Encounter for supervision of other normal pregnancy, unspecified trimester: Secondary | ICD-10-CM

## 2014-05-01 DIAGNOSIS — Z1389 Encounter for screening for other disorder: Secondary | ICD-10-CM

## 2014-05-01 DIAGNOSIS — O360190 Maternal care for anti-D [Rh] antibodies, unspecified trimester, not applicable or unspecified: Secondary | ICD-10-CM

## 2014-05-01 DIAGNOSIS — O360990 Maternal care for other rhesus isoimmunization, unspecified trimester, not applicable or unspecified: Secondary | ICD-10-CM

## 2014-05-01 DIAGNOSIS — Z331 Pregnant state, incidental: Secondary | ICD-10-CM

## 2014-05-01 DIAGNOSIS — Z3483 Encounter for supervision of other normal pregnancy, third trimester: Secondary | ICD-10-CM

## 2014-05-01 LAB — POCT URINALYSIS DIPSTICK
Blood, UA: NEGATIVE
Glucose, UA: NEGATIVE
Ketones, UA: NEGATIVE
Leukocytes, UA: NEGATIVE
NITRITE UA: NEGATIVE
Protein, UA: NEGATIVE

## 2014-05-01 NOTE — Progress Notes (Signed)
[id number] 1068w6d Estimated Date of Delivery: 06/06/14  Blood pressure 118/80, weight 192 lb (87.091 kg), last menstrual period 08/30/2013.   BP weight and urine results all reviewed and noted.  Please refer to the obstetrical flow sheet for the fundal height and fetal heart rate documentation:Getting 3D us on Friday  Patient reports good fetal movement, denies any bleeding and no rupture of membranes symptoms or regular contractions. Patient is without complaints.  Had some contractions last night, none now All questions were answered.  Plan:  Continued routine obstetrical care,   Follow up in 2 weeks for OB appointment, GBS

## 2014-05-15 ENCOUNTER — Ambulatory Visit (INDEPENDENT_AMBULATORY_CARE_PROVIDER_SITE_OTHER): Payer: Medicaid Other | Admitting: Advanced Practice Midwife

## 2014-05-15 ENCOUNTER — Encounter: Payer: Self-pay | Admitting: Advanced Practice Midwife

## 2014-05-15 VITALS — BP 124/62 | Wt 193.0 lb

## 2014-05-15 DIAGNOSIS — Z3685 Encounter for antenatal screening for Streptococcus B: Secondary | ICD-10-CM

## 2014-05-15 DIAGNOSIS — R81 Glycosuria: Secondary | ICD-10-CM

## 2014-05-15 DIAGNOSIS — Z3483 Encounter for supervision of other normal pregnancy, third trimester: Secondary | ICD-10-CM

## 2014-05-15 DIAGNOSIS — Z1389 Encounter for screening for other disorder: Secondary | ICD-10-CM

## 2014-05-15 DIAGNOSIS — Z3480 Encounter for supervision of other normal pregnancy, unspecified trimester: Secondary | ICD-10-CM

## 2014-05-15 DIAGNOSIS — Z131 Encounter for screening for diabetes mellitus: Secondary | ICD-10-CM

## 2014-05-15 DIAGNOSIS — Z331 Pregnant state, incidental: Secondary | ICD-10-CM

## 2014-05-15 DIAGNOSIS — Z1159 Encounter for screening for other viral diseases: Secondary | ICD-10-CM

## 2014-05-15 LAB — POCT URINALYSIS DIPSTICK
Ketones, UA: NEGATIVE
Nitrite, UA: NEGATIVE
PROTEIN UA: NEGATIVE
RBC UA: NEGATIVE

## 2014-05-15 LAB — OB RESULTS CONSOLE GC/CHLAMYDIA
Chlamydia: NEGATIVE
Gonorrhea: NEGATIVE

## 2014-05-15 LAB — GLUCOSE, POCT (MANUAL RESULT ENTRY): POC GLUCOSE: 96 mg/dL (ref 70–99)

## 2014-05-15 NOTE — Progress Notes (Signed)
[id number] [redacted]w[redacted]d Estimated Date of Delivery: 06/06/14  Blood pressure 124/62, weight 193 lb (87.544 kg), last menstrual period 08/30/2013.   BP weight and urine results all reviewed and noted. Blood sugar 92  Please refer to the obstetrical flow sheet for the fundal height and fetal heart rate documentation: recommended Tdap  Patient reports good fetal movement, denies any bleeding and no rupture of membranes symptoms or regular contractions. Patient is without complaints. All questions were answered.  Plan:  Continued routine obstetrical care, GBS today  Follow up in 1 weeks for OB appointment,

## 2014-05-16 LAB — GC/CHLAMYDIA PROBE AMP
CT PROBE, AMP APTIMA: NEGATIVE
GC Probe RNA: NEGATIVE

## 2014-05-17 LAB — STREP B DNA PROBE: STREP GROUP B AG: NOT DETECTED

## 2014-05-22 ENCOUNTER — Ambulatory Visit (INDEPENDENT_AMBULATORY_CARE_PROVIDER_SITE_OTHER): Payer: Medicaid Other | Admitting: Women's Health

## 2014-05-22 ENCOUNTER — Encounter: Payer: Self-pay | Admitting: Women's Health

## 2014-05-22 VITALS — BP 110/62 | Wt 196.0 lb

## 2014-05-22 DIAGNOSIS — Z3483 Encounter for supervision of other normal pregnancy, third trimester: Secondary | ICD-10-CM

## 2014-05-22 DIAGNOSIS — Z1389 Encounter for screening for other disorder: Secondary | ICD-10-CM

## 2014-05-22 DIAGNOSIS — Z3480 Encounter for supervision of other normal pregnancy, unspecified trimester: Secondary | ICD-10-CM

## 2014-05-22 DIAGNOSIS — Z331 Pregnant state, incidental: Secondary | ICD-10-CM

## 2014-05-22 LAB — POCT URINALYSIS DIPSTICK
Glucose, UA: NEGATIVE
KETONES UA: NEGATIVE
Nitrite, UA: NEGATIVE

## 2014-05-22 NOTE — Progress Notes (Signed)
Low-risk OB appointment [id number] [redacted]w[redacted]d Estimated Date of Delivery: 06/06/14 Wt 196 lb (88.905 kg)  LMP 08/30/2013  BP, weight, and urine reviewed.  Refer to obstetrical flow sheet for FH & FHR.  Reports good fm.  Denies regular uc's, lof, vb, or uti s/s. No complaints. SVE per request: no change, 1.5/th/-3, vtx Reviewed labor s/s, fkc. Plan:  Continue routine obstetrical care  F/U in 1wk for OB appointment

## 2014-05-22 NOTE — Patient Instructions (Signed)
Call the office ([phone]) or go to [facility] if:  You begin to have strong, frequent contractions  Your water breaks.  Sometimes it is a big gush of fluid, sometimes it is just a trickle that keeps getting your panties wet or running down your legs  You have vaginal bleeding.  It is normal to have a small amount of spotting if your cervix was checked.   You don't feel your baby moving like normal.  If you don't, get you something to eat and drink and lay down and focus on feeling your baby move.  You should feel at least 10 movements in 2 hours.  If you don't, you should call the office or go to [facility].    Braxton Hicks Contractions Contractions of the uterus can occur throughout pregnancy. Contractions are not always a sign that you are in labor.  WHAT ARE BRAXTON HICKS CONTRACTIONS?  Contractions that occur before labor are called Braxton Hicks contractions, or false labor. Toward the end of pregnancy (32-34 weeks), these contractions can develop more often and may become more forceful. This is not true labor because these contractions do not result in opening (dilatation) and thinning of the cervix. They are sometimes difficult to tell apart from true labor because these contractions can be forceful and people have different pain tolerances. You should not feel embarrassed if you go to the hospital with false labor. Sometimes, the only way to tell if you are in true labor is for your health care provider to look for changes in the cervix. If there are no prenatal problems or other health problems associated with the pregnancy, it is completely safe to be sent home with false labor and await the onset of true labor. HOW CAN YOU TELL THE DIFFERENCE BETWEEN TRUE AND FALSE LABOR? False Labor  The contractions of false labor are usually shorter and not as hard as those of true labor.   The contractions are usually irregular.   The contractions are often felt in the front of  the lower abdomen and in the groin.   The contractions may go away when you walk around or change positions while lying down.   The contractions get weaker and are shorter lasting as time goes on.   The contractions do not usually become progressively stronger, regular, and closer together as with true labor.  True Labor  Contractions in true labor last 30-70 seconds, become very regular, usually become more intense, and increase in frequency.   The contractions do not go away with walking.   The discomfort is usually felt in the top of the uterus and spreads to the lower abdomen and low back.   True labor can be determined by your health care provider with an exam. This will show that the cervix is dilating and getting thinner.  WHAT TO REMEMBER  Keep up with your usual exercises and follow other instructions given by your health care provider.   Take medicines as directed by your health care provider.   Keep your regular prenatal appointments.   Eat and drink lightly if you think you are going into labor.   If Braxton Hicks contractions are making you uncomfortable:   Change your position from lying down or resting to walking, or from walking to resting.   Sit and rest in a tub of warm water.   Drink 2-3 glasses of water. Dehydration may cause these contractions.   Do slow and deep breathing several times an hour.    WHEN SHOULD I SEEK IMMEDIATE MEDICAL CARE? Seek immediate medical care if:  Your contractions become stronger, more regular, and closer together.   You have fluid leaking or gushing from your vagina.   You have a fever.   You pass blood-tinged mucus.   You have vaginal bleeding.   You have continuous abdominal pain.   You have low back pain that you never had before.   You feel your baby's head pushing down and causing pelvic pressure.   Your baby is not moving as much as it used to.  Document Released: 09/06/2005 Document  Revised: 09/11/2013 Document Reviewed: 06/18/2013 [employer] Patient Information 2015 [employer]. This information is not intended to replace advice given to you by your health care provider. Make sure you discuss any questions you have with your health care provider.  

## 2014-05-29 ENCOUNTER — Encounter: Payer: Self-pay | Admitting: Women's Health

## 2014-05-29 ENCOUNTER — Ambulatory Visit (INDEPENDENT_AMBULATORY_CARE_PROVIDER_SITE_OTHER): Payer: Medicaid Other | Admitting: Women's Health

## 2014-05-29 VITALS — BP 130/70 | Wt 193.0 lb

## 2014-05-29 DIAGNOSIS — Z331 Pregnant state, incidental: Secondary | ICD-10-CM

## 2014-05-29 DIAGNOSIS — Z1389 Encounter for screening for other disorder: Secondary | ICD-10-CM

## 2014-05-29 DIAGNOSIS — Z3480 Encounter for supervision of other normal pregnancy, unspecified trimester: Secondary | ICD-10-CM

## 2014-05-29 DIAGNOSIS — Z3483 Encounter for supervision of other normal pregnancy, third trimester: Secondary | ICD-10-CM

## 2014-05-29 NOTE — Progress Notes (Signed)
Low-risk OB appointment [id number] [redacted]w[redacted]d Estimated Date of Delivery: 06/06/14 BP 130/70  Wt 193 lb (87.544 kg)  LMP 08/30/2013  BP, weight, and urine reviewed.  Refer to obstetrical flow sheet for FH & FHR.  Reports good fm.  Denies regular uc's, lof, vb, or uti s/s. No complaints. SVE per request: tight 3/20/-3, vtx Reviewed labor s/s, fkc. Plan:  Continue routine obstetrical care  F/U in 1wk for OB appointment

## 2014-05-29 NOTE — Patient Instructions (Signed)
Call the office ([phone]) or go to [facility] if:  You begin to have strong, frequent contractions  Your water breaks.  Sometimes it is a big gush of fluid, sometimes it is just a trickle that keeps getting your panties wet or running down your legs  You have vaginal bleeding.  It is normal to have a small amount of spotting if your cervix was checked.   You don't feel your baby moving like normal.  If you don't, get you something to eat and drink and lay down and focus on feeling your baby move.  You should feel at least 10 movements in 2 hours.  If you don't, you should call the office or go to [facility].    Braxton Hicks Contractions Contractions of the uterus can occur throughout pregnancy. Contractions are not always a sign that you are in labor.  WHAT ARE BRAXTON HICKS CONTRACTIONS?  Contractions that occur before labor are called Braxton Hicks contractions, or false labor. Toward the end of pregnancy (32-34 weeks), these contractions can develop more often and may become more forceful. This is not true labor because these contractions do not result in opening (dilatation) and thinning of the cervix. They are sometimes difficult to tell apart from true labor because these contractions can be forceful and people have different pain tolerances. You should not feel embarrassed if you go to the hospital with false labor. Sometimes, the only way to tell if you are in true labor is for your health care provider to look for changes in the cervix. If there are no prenatal problems or other health problems associated with the pregnancy, it is completely safe to be sent home with false labor and await the onset of true labor. HOW CAN YOU TELL THE DIFFERENCE BETWEEN TRUE AND FALSE LABOR? False Labor  The contractions of false labor are usually shorter and not as hard as those of true labor.   The contractions are usually irregular.   The contractions are often felt in the front of  the lower abdomen and in the groin.   The contractions may go away when you walk around or change positions while lying down.   The contractions get weaker and are shorter lasting as time goes on.   The contractions do not usually become progressively stronger, regular, and closer together as with true labor.  True Labor  Contractions in true labor last 30-70 seconds, become very regular, usually become more intense, and increase in frequency.   The contractions do not go away with walking.   The discomfort is usually felt in the top of the uterus and spreads to the lower abdomen and low back.   True labor can be determined by your health care provider with an exam. This will show that the cervix is dilating and getting thinner.  WHAT TO REMEMBER  Keep up with your usual exercises and follow other instructions given by your health care provider.   Take medicines as directed by your health care provider.   Keep your regular prenatal appointments.   Eat and drink lightly if you think you are going into labor.   If Braxton Hicks contractions are making you uncomfortable:   Change your position from lying down or resting to walking, or from walking to resting.   Sit and rest in a tub of warm water.   Drink 2-3 glasses of water. Dehydration may cause these contractions.   Do slow and deep breathing several times an hour.    WHEN SHOULD I SEEK IMMEDIATE MEDICAL CARE? Seek immediate medical care if:  Your contractions become stronger, more regular, and closer together.   You have fluid leaking or gushing from your vagina.   You have a fever.   You pass blood-tinged mucus.   You have vaginal bleeding.   You have continuous abdominal pain.   You have low back pain that you never had before.   You feel your baby's head pushing down and causing pelvic pressure.   Your baby is not moving as much as it used to.  Document Released: 09/06/2005 Document  Revised: 09/11/2013 Document Reviewed: 06/18/2013 [employer] Patient Information 2015 [employer]. This information is not intended to replace advice given to you by your health care provider. Make sure you discuss any questions you have with your health care provider.  

## 2014-05-31 ENCOUNTER — Inpatient Hospital Stay (HOSPITAL_COMMUNITY)
Admission: AD | Admit: 2014-05-31 | Discharge: 2014-05-31 | Disposition: A | Discharge status: Home / Self Care | Payer: Medicaid Other | Source: Ambulatory Visit | Attending: Obstetrics & Gynecology | Admitting: Obstetrics & Gynecology

## 2014-05-31 DIAGNOSIS — O479 False labor, unspecified: Secondary | ICD-10-CM | POA: Insufficient documentation

## 2014-05-31 NOTE — Discharge Instructions (Signed)
Fetal Movement Counts °Patient Name: __________________________________________________ Patient Due Date: ____________________ °Performing a fetal movement count is highly recommended in high-risk pregnancies, but it is good for every pregnant woman to do. Your health care provider may ask you to start counting fetal movements at 28 weeks of the pregnancy. Fetal movements often increase: °· After eating a full meal. °· After physical activity. °· After eating or drinking something sweet or cold. °· At rest. °Pay attention to when you feel the baby is most active. This will help you notice a pattern of your baby's sleep and wake cycles and what factors contribute to an increase in fetal movement. It is important to perform a fetal movement count at the same time each day when your baby is normally most active.  °HOW TO COUNT FETAL MOVEMENTS °1. Find a quiet and comfortable area to sit or lie down on your left side. Lying on your left side provides the best blood and oxygen circulation to your baby. °2. Write down the day and time on a sheet of paper or in a journal. °3. Start counting kicks, flutters, swishes, rolls, or jabs in a 2-hour period. You should feel at least 10 movements within 2 hours. °4. If you do not feel 10 movements in 2 hours, wait 2-3 hours and count again. Look for a change in the pattern or not enough counts in 2 hours. °SEEK MEDICAL CARE IF: °· You feel less than 10 counts in 2 hours, tried twice. °· There is no movement in over an hour. °· The pattern is changing or taking longer each day to reach 10 counts in 2 hours. °· You feel the baby is not moving as he or she usually does. °Date: ____________ Movements: ____________ Start time: ____________ Finish time: ____________  °Date: ____________ Movements: ____________ Start time: ____________ Finish time: ____________ °Date: ____________ Movements: ____________ Start time: ____________ Finish time: ____________ °Date: ____________ Movements:  ____________ Start time: ____________ Finish time: ____________ °Date: ____________ Movements: ____________ Start time: ____________ Finish time: ____________ °Date: ____________ Movements: ____________ Start time: ____________ Finish time: ____________ °Date: ____________ Movements: ____________ Start time: ____________ Finish time: ____________ °Date: ____________ Movements: ____________ Start time: ____________ Finish time: ____________  °Date: ____________ Movements: ____________ Start time: ____________ Finish time: ____________ °Date: ____________ Movements: ____________ Start time: ____________ Finish time: ____________ °Date: ____________ Movements: ____________ Start time: ____________ Finish time: ____________ °Date: ____________ Movements: ____________ Start time: ____________ Finish time: ____________ °Date: ____________ Movements: ____________ Start time: ____________ Finish time: ____________ °Date: ____________ Movements: ____________ Start time: ____________ Finish time: ____________ °Date: ____________ Movements: ____________ Start time: ____________ Finish time: ____________  °Date: ____________ Movements: ____________ Start time: ____________ Finish time: ____________ °Date: ____________ Movements: ____________ Start time: ____________ Finish time: ____________ °Date: ____________ Movements: ____________ Start time: ____________ Finish time: ____________ °Date: ____________ Movements: ____________ Start time: ____________ Finish time: ____________ °Date: ____________ Movements: ____________ Start time: ____________ Finish time: ____________ °Date: ____________ Movements: ____________ Start time: ____________ Finish time: ____________ °Date: ____________ Movements: ____________ Start time: ____________ Finish time: ____________  °Date: ____________ Movements: ____________ Start time: ____________ Finish time: ____________ °Date: ____________ Movements: ____________ Start time: ____________ Finish  time: ____________ °Date: ____________ Movements: ____________ Start time: ____________ Finish time: ____________ °Date: ____________ Movements: ____________ Start time: ____________ Finish time: ____________ °Date: ____________ Movements: ____________ Start time: ____________ Finish time: ____________ °Date: ____________ Movements: ____________ Start time: ____________ Finish time: ____________ °Date: ____________ Movements: ____________ Start time: ____________ Finish time: ____________  °Date: ____________ Movements: ____________ Start time: ____________ Finish   time: ____________ °Date: ____________ Movements: ____________ Start time: ____________ Finish time: ____________ °Date: ____________ Movements: ____________ Start time: ____________ Finish time: ____________ °Date: ____________ Movements: ____________ Start time: ____________ Finish time: ____________ °Date: ____________ Movements: ____________ Start time: ____________ Finish time: ____________ °Date: ____________ Movements: ____________ Start time: ____________ Finish time: ____________ °Date: ____________ Movements: ____________ Start time: ____________ Finish time: ____________  °Date: ____________ Movements: ____________ Start time: ____________ Finish time: ____________ °Date: ____________ Movements: ____________ Start time: ____________ Finish time: ____________ °Date: ____________ Movements: ____________ Start time: ____________ Finish time: ____________ °Date: ____________ Movements: ____________ Start time: ____________ Finish time: ____________ °Date: ____________ Movements: ____________ Start time: ____________ Finish time: ____________ °Date: ____________ Movements: ____________ Start time: ____________ Finish time: ____________ °Date: ____________ Movements: ____________ Start time: ____________ Finish time: ____________  °Date: ____________ Movements: ____________ Start time: ____________ Finish time: ____________ °Date: ____________  Movements: ____________ Start time: ____________ Finish time: ____________ °Date: ____________ Movements: ____________ Start time: ____________ Finish time: ____________ °Date: ____________ Movements: ____________ Start time: ____________ Finish time: ____________ °Date: ____________ Movements: ____________ Start time: ____________ Finish time: ____________ °Date: ____________ Movements: ____________ Start time: ____________ Finish time: ____________ °Date: ____________ Movements: ____________ Start time: ____________ Finish time: ____________  °Date: ____________ Movements: ____________ Start time: ____________ Finish time: ____________ °Date: ____________ Movements: ____________ Start time: ____________ Finish time: ____________ °Date: ____________ Movements: ____________ Start time: ____________ Finish time: ____________ °Date: ____________ Movements: ____________ Start time: ____________ Finish time: ____________ °Date: ____________ Movements: ____________ Start time: ____________ Finish time: ____________ °Date: ____________ Movements: ____________ Start time: ____________ Finish time: ____________ °Document Released: 10/06/2006 Document Revised: 01/21/2014 Document Reviewed: 07/03/2012 °[employer]® Patient Information ©2015 [employer]. This information is not intended to replace advice given to you by your health care provider. Make sure you discuss any questions you have with your health care provider. °Braxton Hicks Contractions °Contractions of the uterus can occur throughout pregnancy. Contractions are not always a sign that you are in labor.  °WHAT ARE BRAXTON HICKS CONTRACTIONS?  °Contractions that occur before labor are called Braxton Hicks contractions, or false labor. Toward the end of pregnancy (32-34 weeks), these contractions can develop more often and may become more forceful. This is not true labor because these contractions do not result in opening (dilatation) and thinning of the cervix. They  are sometimes difficult to tell apart from true labor because these contractions can be forceful and people have different pain tolerances. You should not feel embarrassed if you go to the hospital with false labor. Sometimes, the only way to tell if you are in true labor is for your health care provider to look for changes in the cervix. °If there are no prenatal problems or other health problems associated with the pregnancy, it is completely safe to be sent home with false labor and await the onset of true labor. °HOW CAN YOU TELL THE DIFFERENCE BETWEEN TRUE AND FALSE LABOR? °False Labor °· The contractions of false labor are usually shorter and not as hard as those of true labor.   °· The contractions are usually irregular.   °· The contractions are often felt in the front of the lower abdomen and in the groin.   °· The contractions may go away when you walk around or change positions while lying down.   °· The contractions get weaker and are shorter lasting as time goes on.   °· The contractions do not usually become progressively stronger, regular, and closer together as with true labor.   °True Labor °· Contractions in true labor last 30-70 seconds, become   very regular, usually become more intense, and increase in frequency.   °· The contractions do not go away with walking.   °· The discomfort is usually felt in the top of the uterus and spreads to the lower abdomen and low back.   °· True labor can be determined by your health care provider with an exam. This will show that the cervix is dilating and getting thinner.   °WHAT TO REMEMBER °· Keep up with your usual exercises and follow other instructions given by your health care provider.   °· Take medicines as directed by your health care provider.   °· Keep your regular prenatal appointments.   °· Eat and drink lightly if you think you are going into labor.   °· If Braxton Hicks contractions are making you uncomfortable:   °¨ Change your position from  lying down or resting to walking, or from walking to resting.   °¨ Sit and rest in a tub of warm water.   °¨ Drink 2-3 glasses of water. Dehydration may cause these contractions.   °¨ Do slow and deep breathing several times an hour.   °WHEN SHOULD I SEEK IMMEDIATE MEDICAL CARE? °Seek immediate medical care if: °· Your contractions become stronger, more regular, and closer together.   °· You have fluid leaking or gushing from your vagina.   °· You have a fever.   °· You pass blood-tinged mucus.   °· You have vaginal bleeding.   °· You have continuous abdominal pain.   °· You have low back pain that you never had before.   °· You feel your baby's head pushing down and causing pelvic pressure.   °· Your baby is not moving as much as it used to.   °Document Released: 09/06/2005 Document Revised: 09/11/2013 Document Reviewed: 06/18/2013 °Exit[employer]® Patient Information ©2015 [employer]. This information is not intended to replace advice given to you by your health care provider. Make sure you discuss any questions you have with your health care provider. ° °

## 2014-05-31 NOTE — MAU Note (Signed)
UC every 3-6 mins since 00:30. Denies leaking or bloody show

## 2014-06-03 ENCOUNTER — Ambulatory Visit (INDEPENDENT_AMBULATORY_CARE_PROVIDER_SITE_OTHER): Payer: Medicaid Other | Admitting: Obstetrics and Gynecology

## 2014-06-03 ENCOUNTER — Encounter: Payer: Self-pay | Admitting: Obstetrics and Gynecology

## 2014-06-03 VITALS — BP 120/80 | Wt 194.0 lb

## 2014-06-03 DIAGNOSIS — Z3480 Encounter for supervision of other normal pregnancy, unspecified trimester: Secondary | ICD-10-CM

## 2014-06-03 DIAGNOSIS — Z3483 Encounter for supervision of other normal pregnancy, third trimester: Secondary | ICD-10-CM

## 2014-06-03 LAB — POCT URINALYSIS DIPSTICK
Glucose, UA: NEGATIVE
Ketones, UA: NEGATIVE
LEUKOCYTES UA: NEGATIVE
NITRITE UA: NEGATIVE
PROTEIN UA: NEGATIVE
RBC UA: NEGATIVE

## 2014-06-03 NOTE — Progress Notes (Signed)
[id number] [redacted]w[redacted]d Estimated Date of Delivery: 06/06/14  Blood pressure 120/80, weight 194 lb (87.998 kg), last menstrual period 08/30/2013.   refer to the ob flow sheet for FH and FHR, also BP, Wt, Urine results:negative  Patient reports good fetal movement, denies any bleeding and no rupture of membranes symptoms or regular contractions. Patient complaints: She states that she has had an increase in pinkish/brownish discharge with a yellow tint.  She is having contractions that are 3-4 minutes apart with a strong contraction occuring every 15 minutes.  She was seen at [facility] last Friday morning.   CERVIX: Soft. 1.5/20/-2  Questions were answered. A lo risk preg@[redacted]w[redacted]d   Plan:  Continued routine obstetrical care, F/u in 1 weeks    This chart was scribed by Carl Best, Medical Scribe, for Dr. Christin Bach on 06/03/2014 at 10:10 AM. This chart was reviewed by Dr. Christin Bach for accuracy.

## 2014-06-03 NOTE — Progress Notes (Deleted)
Pt states that she has had an increase in discharge, pinkish/brownish, that has kind of turned to a yellowish tent. Pt states that she is having contractions about 3-4 minutes apart and about every 15 minutes she has a strong contraction. Pt is just concerned about this, pt was seen at [facility] on Friday morning.

## 2014-06-05 ENCOUNTER — Encounter: Payer: Medicaid Other | Admitting: Advanced Practice Midwife

## 2014-06-10 ENCOUNTER — Ambulatory Visit (INDEPENDENT_AMBULATORY_CARE_PROVIDER_SITE_OTHER): Payer: Medicaid Other | Admitting: Women's Health

## 2014-06-10 ENCOUNTER — Telehealth (HOSPITAL_COMMUNITY): Payer: Self-pay | Admitting: *Deleted

## 2014-06-10 ENCOUNTER — Encounter: Payer: Self-pay | Admitting: Women's Health

## 2014-06-10 ENCOUNTER — Inpatient Hospital Stay (HOSPITAL_COMMUNITY)
Admission: AD | Admit: 2014-06-10 | Discharge: 2014-06-12 | DRG: 775 | Disposition: A | Discharge status: Home / Self Care | Payer: Medicaid Other | Source: Ambulatory Visit | Attending: Family Medicine | Admitting: Family Medicine

## 2014-06-10 ENCOUNTER — Encounter (HOSPITAL_COMMUNITY): Payer: Medicaid Other | Admitting: Anesthesiology

## 2014-06-10 ENCOUNTER — Inpatient Hospital Stay (HOSPITAL_COMMUNITY): Payer: Medicaid Other | Admitting: Anesthesiology

## 2014-06-10 ENCOUNTER — Encounter (HOSPITAL_COMMUNITY): Payer: Self-pay | Admitting: *Deleted

## 2014-06-10 VITALS — BP 122/70 | Wt 197.0 lb

## 2014-06-10 DIAGNOSIS — F341 Dysthymic disorder: Secondary | ICD-10-CM | POA: Diagnosis present

## 2014-06-10 DIAGNOSIS — Z87442 Personal history of urinary calculi: Secondary | ICD-10-CM | POA: Diagnosis not present

## 2014-06-10 DIAGNOSIS — O99334 Smoking (tobacco) complicating childbirth: Secondary | ICD-10-CM | POA: Diagnosis present

## 2014-06-10 DIAGNOSIS — Z8249 Family history of ischemic heart disease and other diseases of the circulatory system: Secondary | ICD-10-CM | POA: Diagnosis not present

## 2014-06-10 DIAGNOSIS — Z833 Family history of diabetes mellitus: Secondary | ICD-10-CM

## 2014-06-10 DIAGNOSIS — Z3483 Encounter for supervision of other normal pregnancy, third trimester: Secondary | ICD-10-CM

## 2014-06-10 DIAGNOSIS — O360990 Maternal care for other rhesus isoimmunization, unspecified trimester, not applicable or unspecified: Secondary | ICD-10-CM | POA: Diagnosis present

## 2014-06-10 DIAGNOSIS — R002 Palpitations: Secondary | ICD-10-CM | POA: Diagnosis present

## 2014-06-10 DIAGNOSIS — Z1389 Encounter for screening for other disorder: Secondary | ICD-10-CM

## 2014-06-10 DIAGNOSIS — O479 False labor, unspecified: Secondary | ICD-10-CM | POA: Diagnosis present

## 2014-06-10 DIAGNOSIS — O99344 Other mental disorders complicating childbirth: Secondary | ICD-10-CM | POA: Diagnosis present

## 2014-06-10 DIAGNOSIS — Z3480 Encounter for supervision of other normal pregnancy, unspecified trimester: Secondary | ICD-10-CM

## 2014-06-10 DIAGNOSIS — Z331 Pregnant state, incidental: Secondary | ICD-10-CM

## 2014-06-10 DIAGNOSIS — IMO0001 Reserved for inherently not codable concepts without codable children: Secondary | ICD-10-CM

## 2014-06-10 LAB — MRSA PCR SCREENING: MRSA BY PCR: NEGATIVE

## 2014-06-10 LAB — POCT URINALYSIS DIPSTICK
Blood, UA: NEGATIVE
GLUCOSE UA: NEGATIVE
Ketones, UA: NEGATIVE
Leukocytes, UA: NEGATIVE
Nitrite, UA: NEGATIVE
PROTEIN UA: NEGATIVE

## 2014-06-10 LAB — CBC
HEMATOCRIT: 33.9 % — AB (ref 36.0–46.0)
HEMOGLOBIN: 11.7 g/dL — AB (ref 12.0–15.0)
MCH: 30.2 pg (ref 26.0–34.0)
MCHC: 34.5 g/dL (ref 30.0–36.0)
MCV: 87.4 fL (ref 78.0–100.0)
Platelets: 253 10*3/uL (ref 150–400)
RBC: 3.88 MIL/uL (ref 3.87–5.11)
RDW: 13.7 % (ref 11.5–15.5)
WBC: 23 10*3/uL — ABNORMAL HIGH (ref 4.0–10.5)

## 2014-06-10 MED ORDER — PRENATAL MULTIVITAMIN CH
1.0000 | ORAL_TABLET | Freq: Every day | ORAL | Status: DC
  Filled 2014-06-10: qty 1

## 2014-06-10 MED ORDER — OXYTOCIN BOLUS FROM INFUSION
500.0000 mL | INTRAVENOUS | Status: DC
  Administered 2014-06-11: 500 mL via INTRAVENOUS

## 2014-06-10 MED ORDER — ACETAMINOPHEN 325 MG PO TABS: 650.0000 mg | ORAL_TABLET | ORAL | Status: DC | PRN

## 2014-06-10 MED ORDER — ONDANSETRON HCL 4 MG/2ML IJ SOLN
4.0000 mg | Freq: Four times a day (QID) | INTRAMUSCULAR | Status: DC | PRN
  Administered 2014-06-10: 4 mg via INTRAVENOUS
  Filled 2014-06-10: qty 2

## 2014-06-10 MED ORDER — PHENYLEPHRINE 40 MCG/ML (10ML) SYRINGE FOR IV PUSH (FOR BLOOD PRESSURE SUPPORT)
80.0000 ug | PREFILLED_SYRINGE | INTRAVENOUS | Status: DC | PRN
  Filled 2014-06-10: qty 2
  Filled 2014-06-10: qty 10

## 2014-06-10 MED ORDER — OXYCODONE-ACETAMINOPHEN 5-325 MG PO TABS: 2.0000 | ORAL_TABLET | ORAL | Status: DC | PRN

## 2014-06-10 MED ORDER — LIDOCAINE HCL (PF) 1 % IJ SOLN
30.0000 mL | INTRAMUSCULAR | Status: DC | PRN
  Filled 2014-06-10: qty 30

## 2014-06-10 MED ORDER — LACTATED RINGERS IV SOLN
500.0000 mL | Freq: Once | INTRAVENOUS | Status: AC
  Administered 2014-06-10: 500 mL via INTRAVENOUS

## 2014-06-10 MED ORDER — DIPHENHYDRAMINE HCL 50 MG/ML IJ SOLN: 12.5000 mg | INTRAMUSCULAR | Status: DC | PRN

## 2014-06-10 MED ORDER — OXYTOCIN 40 UNITS IN LACTATED RINGERS INFUSION - SIMPLE MED
62.5000 mL/h | INTRAVENOUS | Status: DC
  Filled 2014-06-10: qty 1000

## 2014-06-10 MED ORDER — OXYCODONE-ACETAMINOPHEN 5-325 MG PO TABS: 1.0000 | ORAL_TABLET | ORAL | Status: DC | PRN

## 2014-06-10 MED ORDER — FENTANYL 2.5 MCG/ML BUPIVACAINE 1/10 % EPIDURAL INFUSION (WH - ANES)
14.0000 mL/h | INTRAMUSCULAR | Status: DC | PRN
  Administered 2014-06-10: 14 mL/h via EPIDURAL
  Filled 2014-06-10: qty 125

## 2014-06-10 MED ORDER — EPHEDRINE 5 MG/ML INJ
10.0000 mg | INTRAVENOUS | Status: DC | PRN
  Filled 2014-06-10: qty 2

## 2014-06-10 MED ORDER — LIDOCAINE HCL (PF) 1 % IJ SOLN
INTRAMUSCULAR | Status: DC | PRN
  Administered 2014-06-10 (×2): 5 mL

## 2014-06-10 MED ORDER — PHENYLEPHRINE 40 MCG/ML (10ML) SYRINGE FOR IV PUSH (FOR BLOOD PRESSURE SUPPORT)
80.0000 ug | PREFILLED_SYRINGE | INTRAVENOUS | Status: DC | PRN
Start: 2014-06-10 — End: 2014-06-11
  Filled 2014-06-10: qty 2

## 2014-06-10 MED ORDER — LACTATED RINGERS IV SOLN
INTRAVENOUS | Status: DC
  Administered 2014-06-10: 23:00:00 via INTRAVENOUS

## 2014-06-10 MED ORDER — LACTATED RINGERS IV SOLN: 500.0000 mL | INTRAVENOUS | Status: DC | PRN

## 2014-06-10 MED ORDER — CITRIC ACID-SODIUM CITRATE 334-500 MG/5ML PO SOLN: 30.0000 mL | ORAL | Status: DC | PRN

## 2014-06-10 NOTE — Progress Notes (Signed)
122/70

## 2014-06-10 NOTE — Progress Notes (Signed)
Patient ID: BLONDELL LAPERLE, female   DOB: 04-05-1986, 28 y.o.   MRN: [id number]  Labor Progress Note  ASSESSMENT:   NAWAL BURLING 28 y.o. [id number] at [redacted]w[redacted]d in active labor   PLAN:  1) Labor curve reviewed.       Progress: Active      Plan: Expectant management, consider AROM if minimal cervical change in 2 hours  1700: 4/70/-3  1900: 5/30/-2  2100: 7/50/-2  2) Fetal heart tracing reviewed.    Cat II, reassuring Only late decel which self resolved  3) GBS Status - neg  4) Other Problems Active Problems:   Active labor   SUBJECTIVE:  Feeling pressure and pain with contractions. Desires epidural   OBJECTIVE:  Vital Signs: No data found.  SVE: Dilation: 7, 50%, Station: -2  FHR Monitoring Baseline Rate (A): 145 bpm / mod variability / + accels / + 1 late decel   Accelerations: 15 x 15 Contraction Frequency (min): 3-5

## 2014-06-10 NOTE — Anesthesia Procedure Notes (Signed)
Epidural Patient location during procedure: OB Start time: 06/10/2014 10:31 PM  Staffing Anesthesiologist: Brayton Caves Performed by: anesthesiologist   Preanesthetic Checklist Completed: patient identified, site marked, surgical consent, pre-op evaluation, timeout performed, IV checked, risks and benefits discussed and monitors and equipment checked  Epidural Patient position: sitting Prep: site prepped and draped and DuraPrep Patient monitoring: continuous pulse ox and blood pressure Approach: midline Location: L3-L4 Injection technique: LOR air  Needle:  Needle type: Tuohy  Needle gauge: 17 G Needle length: 9 cm and 9 Needle insertion depth: 5 cm cm Catheter type: closed end flexible Catheter size: 19 Gauge Catheter at skin depth: 10 cm Test dose: negative  Assessment Events: blood not aspirated, injection not painful, no injection resistance, negative IV test and no paresthesia  Additional Notes Patient identified.  Risk benefits discussed including failed block, incomplete pain control, headache, nerve damage, paralysis, blood pressure changes, nausea, vomiting, reactions to medication both toxic or allergic, and postpartum back pain.  Patient expressed understanding and wished to proceed.  All questions were answered.  Sterile technique used throughout procedure and epidural site dressed with sterile barrier dressing. No paresthesia or other complications noted.The patient did not experience any signs of intravascular injection such as tinnitus or metallic taste in mouth nor signs of intrathecal spread such as rapid motor block. Please see nursing notes for vital signs.

## 2014-06-10 NOTE — MAU Note (Signed)
Patient states she is having contractions every 5 minutes. Was 4cm in the office this am and had membranes stripped. Reports bloody show. Good fetal movement.

## 2014-06-10 NOTE — Anesthesia Preprocedure Evaluation (Signed)
Anesthesia Evaluation  Patient identified by MRN, date of birth, ID band Patient awake    Reviewed: Allergy & Precautions, H&P , Patient's Chart, lab work & pertinent test results  Airway Mallampati: II TM Distance: >3 FB Neck ROM: full    Dental   Pulmonary Current Smoker,  breath sounds clear to auscultation        Cardiovascular Rhythm:regular Rate:Normal     Neuro/Psych    GI/Hepatic   Endo/Other    Renal/GU Renal disease     Musculoskeletal   Abdominal   Peds  Hematology   Anesthesia Other Findings ptsd  Reproductive/Obstetrics (+) Pregnancy                           Anesthesia Physical Anesthesia Plan  ASA: II  Anesthesia Plan: Epidural   Post-op Pain Management:    Induction:   Airway Management Planned:   Additional Equipment:   Intra-op Plan:   Post-operative Plan:   Informed Consent: I have reviewed the patients History and Physical, chart, labs and discussed the procedure including the risks, benefits and alternatives for the proposed anesthesia with the patient or authorized representative who has indicated his/her understanding and acceptance.     Plan Discussed with:   Anesthesia Plan Comments:         Anesthesia Quick Evaluation

## 2014-06-10 NOTE — Patient Instructions (Addendum)
Your induction is scheduled for 9/24 @ 7:30pm. Go to [name] hospital, Maternity Admissions Unit (Emergency) entrance and let them know you are there to be induced. They will send someone from Labor & Delivery to come get you.    Call the office [phone]) or go to [facility] if:  You begin to have strong, frequent contractions  Your water breaks.  Sometimes it is a big gush of fluid, sometimes it is just a trickle that keeps getting your panties wet or running down your legs  You have vaginal bleeding.  It is normal to have a small amount of spotting if your cervix was checked.   You don't feel your baby moving like normal.  If you don't, get you something to eat and drink and lay down and focus on feeling your baby move.  You should feel at least 10 movements in 2 hours.  If you don't, you should call the office or go to [facility].    [facility] Contractions Contractions of the uterus can occur throughout pregnancy. Contractions are not always a sign that you are in labor.  WHAT ARE BRAXTON HICKS CONTRACTIONS?  Contractions that occur before labor are called Braxton Hicks contractions, or false labor. Toward the end of pregnancy (32-34 weeks), these contractions can develop more often and may become more forceful. This is not true labor because these contractions do not result in opening (dilatation) and thinning of the cervix. They are sometimes difficult to tell apart from true labor because these contractions can be forceful and people have different pain tolerances. You should not feel embarrassed if you go to the hospital with false labor. Sometimes, the only way to tell if you are in true labor is for your health care provider to look for changes in the cervix. If there are no prenatal problems or other health problems associated with the pregnancy, it is completely safe to be sent home with false labor and await the onset of true labor. HOW CAN YOU TELL THE DIFFERENCE  BETWEEN TRUE AND FALSE LABOR? False Labor  The contractions of false labor are usually shorter and not as hard as those of true labor.   The contractions are usually irregular.   The contractions are often felt in the front of the lower abdomen and in the groin.   The contractions may go away when you walk around or change positions while lying down.   The contractions get weaker and are shorter lasting as time goes on.   The contractions do not usually become progressively stronger, regular, and closer together as with true labor.  True Labor  Contractions in true labor last 30-70 seconds, become very regular, usually become more intense, and increase in frequency.   The contractions do not go away with walking.   The discomfort is usually felt in the top of the uterus and spreads to the lower abdomen and low back.   True labor can be determined by your health care provider with an exam. This will show that the cervix is dilating and getting thinner.  WHAT TO REMEMBER  Keep up with your usual exercises and follow other instructions given by your health care provider.   Take medicines as directed by your health care provider.   Keep your regular prenatal appointments.   Eat and drink lightly if you think you are going into labor.   If Braxton Hicks contractions are making you uncomfortable:   Change your position from lying down or  resting to walking, or from walking to resting.   Sit and rest in a tub of warm water.   Drink 2-3 glasses of water. Dehydration may cause these contractions.   Do slow and deep breathing several times an hour.  WHEN SHOULD I SEEK IMMEDIATE MEDICAL CARE? Seek immediate medical care if:  Your contractions become stronger, more regular, and closer together.   You have fluid leaking or gushing from your vagina.   You have a fever.   You pass blood-tinged mucus.   You have vaginal bleeding.   You have continuous  abdominal pain.   You have low back pain that you never had before.   You feel your baby's head pushing down and causing pelvic pressure.   Your baby is not moving as much as it used to.  Document Released: 09/06/2005 Document Revised: 09/11/2013 Document Reviewed: 06/18/2013 [facility] Patient Information 2015 [place], [place]. This information is not intended to replace advice given to you by your health care provider. Make sure you discuss any questions you have with your health care provider.

## 2014-06-10 NOTE — H&P (Signed)
LABOR ADMISSION HISTORY AND PHYSICAL  NITA WHITMIRE is a 28 y.o. female [phone] with IUP at [redacted]w[redacted]d by LMP presenting for contractions. Pt presents w/ contractions since stripping membranes today at regular OB visit. She reports +FMs, No LOF, no VB, no blurry vision, headaches or peripheral edema, and RUQ pain. She desires an epidural for labor pain control. She plans on breast and bottle feeding. She request IUD for birth control. Epidural ordered.  Dating: By LMP  --->  Estimated Date of Delivery: 06/06/14  Sono:  Normal anatomic   Prenatal History/Complications: Rh neg Smoker Palpitations  Past Medical History: Past Medical History  Diagnosis Date  . Kidney stones   . Kidney infection   . PTSD (post-traumatic stress disorder)   . Depression   . Anxiety     Past Surgical History: Past Surgical History  Procedure Laterality Date  . Tonsillectomy    . Anterior cruciate ligament repair  2005  . Cholecystectomy      Obstetrical History: OB History   Grav Para Term Preterm Abortions TAB SAB Ect Mult Living   0 4 0 4 0 0 2      Gynecological History: OB History   Grav Para Term Preterm Abortions TAB SAB Ect Mult Living   0 4 0 4 0 0 2      Social History: History   Social History  . Marital Status: Married    Spouse Name: N/A    Number of Children: N/A  . Years of Education: N/A   Social History Main Topics  . Smoking status: Current Some Day Smoker -- 0.25 packs/day for 10 years    Types: Cigarettes  . Smokeless tobacco: Never Used  . Alcohol Use: No     Comment: rarely  . Drug Use: No  . Sexual Activity: Yes    Birth Control/ Protection: None   Other Topics Concern  . None   Social History Narrative  . None    Family History: Family History  Problem Relation Age of Onset  . Hyperlipidemia Neg Hx   . Stroke Neg Hx   . Cancer Mother 1    breast  . Chiari malformation Brother   . Hypertension Maternal Grandmother   . Heart disease  Maternal Grandfather   . Diabetes Maternal Grandfather   . Heart failure Maternal Grandfather   . Kidney disease Maternal Grandfather   . Hypertension Paternal Grandmother     Allergies: Allergies  Allergen Reactions  . Phenergan [Promethazine] Rash and Other (See Comments)    REACTION unknown (nerve difficulty)  . Tramadol Rash    Prescriptions prior to admission  Medication Sig Dispense Refill  . acetaminophen (TYLENOL) 500 MG tablet Take 500 mg by mouth as needed for moderate pain or headache.       . Prenatal Vit-Fe Fumarate-FA (PRENATAL MULTIVITAMIN) TABS tablet Take 1 tablet by mouth daily at 12 noon.         Review of Systems   All systems reviewed and negative except as stated in HPI  Blood pressure 131/73, pulse 89, temperature 99.4 F (37.4 C), temperature source Oral, resp. rate 16, height  (1.6 m), weight 195 lb 9.6 oz (88.724 kg), last menstrual period 08/30/2013, SpO2 99.00%. General appearance: alert Lungs: clear to auscultation bilaterally Heart: regular rate and rhythm Abdomen: soft, non-tender; bowel sounds normal Pelvic: Adequate Extremities: Homans sign is negative, no sign of DVT Presentation: cephalic Fetal monitoringBaseline: 150 bpm, Variability: Good {>  6 bpm), Accelerations: Reactive and Decelerations: Absent Uterine activityFrequency: Every 2-5 minutes, Duration: 30 seconds and Intensity: strong Dilation: 5 Effacement (%): 30 Station: -2 Exam by:: Dr. Su Hilt   Prenatal labs: ABO, Rh: O/NEG/-- (02/11 1000) Antibody: NEG (07/01 0000) Rubella:   RPR: NON REAC (07/01 0000)  HBsAg: NEGATIVE (02/11 1000)  HIV: NONREACTIVE (07/01 0000)  GBS: NOT DETECTED (08/26 1200)  2 hr Glucola 73/141/125 Genetic screening  NT/IT: neg Anatomy US WNL   Prenatal Transfer Tool  Maternal Diabetes: No Genetic Screening: Normal Maternal Ultrasounds/Referrals: Normal Fetal Ultrasounds or other Referrals:  None Maternal Substance Abuse:   No Significant Maternal Medications:  None Significant Maternal Lab Results: Lab values include: Group B Strep negative, Rh negative     Results for orders placed in visit on 06/10/14 (from the past 24 hour(s))  POCT URINALYSIS DIPSTICK   Collection Time    06/10/14  8:42 AM      Result Value Ref Range   Color, UA       Clarity, UA       Glucose, UA neg     Bilirubin, UA       Ketones, UA neg     Spec Grav, UA       Blood, UA neg     pH, UA       Protein, UA neg     Urobilinogen, UA       Nitrite, UA neg     Leukocytes, UA Negative      Patient Active Problem List   Diagnosis Date Noted  . Rapid palpitations 01/16/2014  . Smoker 12/26/2013  . Supervision of other normal pregnancy 10/31/2013  . Rh negative state in antepartum period 10/31/2013    Assessment: DAZIA LIPPOLD is a 28 y.o. [id number] at [redacted]w[redacted]d here for Labor at term  28 y.o. @ at [redacted]w[redacted]d by LMP in active labor with Cat I strip.  #Labor: Expectant management; consider AROM #Pain: Epidural #FWB: Cat I, reassuring #ID:  GBS neg #MOF: Breast + Formila #MOC: Mirenda #Circ:  IT'S A GIRL!  Elita Boone 06/10/2014, 7:23 PM

## 2014-06-10 NOTE — Telephone Encounter (Signed)
Preadmission screen  

## 2014-06-10 NOTE — Progress Notes (Signed)
Low-risk OB appointment [id number] [redacted]w[redacted]d Estimated Date of Delivery: 06/06/14 BP 122/70  Wt 197 lb (89.359 kg)  LMP 08/30/2013  BP, weight, and urine reviewed.  Refer to obstetrical flow sheet for FH & FHR.  Reports good fm.  Denies regular uc's, lof, vb, or uti s/s. No complaints. SVE: 3.5/70/-2, vtx Offered membrane sweeping, discussed r/b- pt decided to proceed, so membranes swept, cx now 4/70/-2 Reviewed labor s/s, fkc. Plan:  IOL 9/24 @ 1930 for PD if needed (no am slots available)  F/U in 6wks for pp visit

## 2014-06-11 ENCOUNTER — Encounter (HOSPITAL_COMMUNITY): Payer: Self-pay | Admitting: *Deleted

## 2014-06-11 DIAGNOSIS — O99344 Other mental disorders complicating childbirth: Secondary | ICD-10-CM

## 2014-06-11 DIAGNOSIS — F341 Dysthymic disorder: Secondary | ICD-10-CM

## 2014-06-11 DIAGNOSIS — O360990 Maternal care for other rhesus isoimmunization, unspecified trimester, not applicable or unspecified: Secondary | ICD-10-CM

## 2014-06-11 LAB — CBC
HCT: 30.3 % — ABNORMAL LOW (ref 36.0–46.0)
Hemoglobin: 10.4 g/dL — ABNORMAL LOW (ref 12.0–15.0)
MCH: 30 pg (ref 26.0–34.0)
MCHC: 34.3 g/dL (ref 30.0–36.0)
MCV: 87.3 fL (ref 78.0–100.0)
Platelets: 232 10*3/uL (ref 150–400)
RBC: 3.47 MIL/uL — AB (ref 3.87–5.11)
RDW: 13.7 % (ref 11.5–15.5)
WBC: 21.9 10*3/uL — AB (ref 4.0–10.5)

## 2014-06-11 LAB — RPR

## 2014-06-11 LAB — HIV ANTIBODY (ROUTINE TESTING W REFLEX): HIV: NONREACTIVE

## 2014-06-11 MED ORDER — DIBUCAINE 1 % RE OINT: 1.0000 "application " | TOPICAL_OINTMENT | RECTAL | Status: DC | PRN

## 2014-06-11 MED ORDER — SIMETHICONE 80 MG PO CHEW
80.0000 mg | CHEWABLE_TABLET | ORAL | Status: DC | PRN
Start: 2014-06-11 — End: 2014-06-12

## 2014-06-11 MED ORDER — ONDANSETRON HCL 4 MG/2ML IJ SOLN: 4.0000 mg | INTRAMUSCULAR | Status: DC | PRN

## 2014-06-11 MED ORDER — LANOLIN HYDROUS EX OINT: TOPICAL_OINTMENT | CUTANEOUS | Status: DC | PRN

## 2014-06-11 MED ORDER — SENNOSIDES-DOCUSATE SODIUM 8.6-50 MG PO TABS
2.0000 | ORAL_TABLET | ORAL | Status: DC
  Administered 2014-06-11 (×2): 2 via ORAL
  Filled 2014-06-11 (×2): qty 2

## 2014-06-11 MED ORDER — IBUPROFEN 600 MG PO TABS
600.0000 mg | ORAL_TABLET | Freq: Four times a day (QID) | ORAL | Status: DC
  Administered 2014-06-11 – 2014-06-12 (×6): 600 mg via ORAL
  Filled 2014-06-11 (×6): qty 1

## 2014-06-11 MED ORDER — DIPHENHYDRAMINE HCL 25 MG PO CAPS: 25.0000 mg | ORAL_CAPSULE | Freq: Four times a day (QID) | ORAL | Status: DC | PRN

## 2014-06-11 MED ORDER — WITCH HAZEL-GLYCERIN EX PADS: 1.0000 "application " | MEDICATED_PAD | CUTANEOUS | Status: DC | PRN

## 2014-06-11 MED ORDER — OXYCODONE-ACETAMINOPHEN 5-325 MG PO TABS
1.0000 | ORAL_TABLET | ORAL | Status: DC | PRN
Start: 2014-06-11 — End: 2014-06-12

## 2014-06-11 MED ORDER — TETANUS-DIPHTH-ACELL PERTUSSIS 5-2.5-18.5 LF-MCG/0.5 IM SUSP: 0.5000 mL | Freq: Once | INTRAMUSCULAR | Status: DC

## 2014-06-11 MED ORDER — PRENATAL MULTIVITAMIN CH
1.0000 | ORAL_TABLET | Freq: Every day | ORAL | Status: DC
  Filled 2014-06-11: qty 1

## 2014-06-11 MED ORDER — OXYCODONE-ACETAMINOPHEN 5-325 MG PO TABS
2.0000 | ORAL_TABLET | ORAL | Status: DC | PRN
Start: 2014-06-11 — End: 2014-06-12

## 2014-06-11 MED ORDER — ONDANSETRON HCL 4 MG PO TABS: 4.0000 mg | ORAL_TABLET | ORAL | Status: DC | PRN

## 2014-06-11 MED ORDER — ZOLPIDEM TARTRATE 5 MG PO TABS: 5.0000 mg | ORAL_TABLET | Freq: Every evening | ORAL | Status: DC | PRN

## 2014-06-11 MED ORDER — BENZOCAINE-MENTHOL 20-0.5 % EX AERO
1.0000 "application " | INHALATION_SPRAY | CUTANEOUS | Status: DC | PRN
  Filled 2014-06-11: qty 56

## 2014-06-11 NOTE — Anesthesia Postprocedure Evaluation (Signed)
  Anesthesia Post-op Note  Patient: [name]  Procedure(s) Performed: * No procedures listed *  Patient Location: Mother/Baby  Anesthesia Type:Epidural  Level of Consciousness: awake, alert , oriented and patient cooperative  Airway and Oxygen Therapy: Patient Spontanous Breathing  Post-op Pain: mild  Post-op Assessment: Patient's Cardiovascular Status Stable, Respiratory Function Stable, No headache, No backache, No residual numbness and No residual motor weakness  Post-op Vital Signs: stable  Last Vitals:  Filed Vitals:   06/11/14 0530  BP: 112/60  Pulse: 76  Temp: 37 C  Resp: 16    Complications: No apparent anesthesia complications

## 2014-06-11 NOTE — Progress Notes (Signed)
Patient states she was up to void and has now laid back down to sleep. Asked if we could do assessment "in a little while".  I told patient I would be back in an hour. She reports minimal bleeding, no pain at this time.

## 2014-06-11 NOTE — Lactation Note (Signed)
This note was copied from the chart of [name]. Lactation Consultation Note: Initial visit with mom. She has baby latched to breast when I went into room. Reports that she has been feeding for 35 min on and off.Reports that this baby is doing better than her first two babies. Reports that she has mastitis with both previous babies and stopped breast feeding because of that. With first baby mastitis at 3 days- the day she got home from hospital and with second at 1 month. Reports having plentiful milk supply and also giving some bottles of formula and not pumping much. Encouraged to only breast feed and to make sure the breasts are being emptied frequently- if she gives bottles she will need to pump to empty, Mom verbalizes understanding. No questions at present. To call for assist prn  Patient Name: [name] [id number] Date: 06/11/2014 Reason for consult: Initial assessment   Maternal Data Formula Feeding for Exclusion: Yes Reason for exclusion: Mother's choice to formula and breast feed on admission Does the patient have breastfeeding experience prior to this delivery?: Yes  Feeding Feeding Type: Breast Fed  LATCH Score/Interventions Latch: Grasps breast easily, tongue down, lips flanged, rhythmical sucking.  Audible Swallowing: A few with stimulation  Type of Nipple: Everted at rest and after stimulation  Comfort (Breast/Nipple): Soft / non-tender     Hold (Positioning): No assistance needed to correctly position infant at breast.  LATCH Score: 9  Lactation Tools Discussed/Used     Consult Status Consult Status: Follow-up Date: 06/12/14 Follow-up type: In-patient    Pamelia Hoit 06/11/2014, 2:00 PM

## 2014-06-11 NOTE — Progress Notes (Signed)
Patient ID: [name], female   DOB: [birth year], 28 y.o.   MRN: [id number]  Labor Progress Note  ASSESSMENT:   KAVITA BARTL 28 y.o. [id number] at [redacted]w[redacted]d in active labor   PLAN:  1) Labor curve reviewed.       Progress: Active      Plan: Expectant management, consider AROM if minimal cervical change in 2 hours  1700: 4/70/-3  1900: 5/30/-2  2100: 7/50/-2  2300: 7.5/70/-1  0030: 9/90/0 --> AROM clear    2) Fetal heart tracing reviewed.    Cat I, reassuring  3) GBS Status - neg  4) Other Problems Active Problems:   Active labor   SUBJECTIVE:  Endorses pain relief with epidural. Denies rectal pressure.   OBJECTIVE:  Vital Signs:  Patient Vitals for the past 2 hrs:  BP Pulse Resp  06/11/14 0031 128/69 mmHg 71 18  06/11/14 0001 117/60 mmHg 67 18  06/10/14 2331 107/60 mmHg 74 18  06/10/14 2311 123/63 mmHg 77 -   SVE: Dilation: 9, 90%, Station: 0  FHR Monitoring Baseline Rate (A): 145 bpm / mod variability / + accels / + 1 late decel   Accelerations: 15 x 15 Contraction Frequency (min): 3-5

## 2014-06-11 NOTE — Progress Notes (Signed)
UR chart review completed.  

## 2014-06-12 MED ORDER — IBUPROFEN 600 MG PO TABS: 600.0000 mg | ORAL_TABLET | Freq: Four times a day (QID) | ORAL | Status: DC | PRN

## 2014-06-12 MED ORDER — MEASLES, MUMPS & RUBELLA VAC ~~LOC~~ INJ
0.5000 mL | INJECTION | Freq: Once | SUBCUTANEOUS | Status: AC
  Administered 2014-06-12: 0.5 mL via SUBCUTANEOUS
  Filled 2014-06-12: qty 0.5

## 2014-06-12 NOTE — Discharge Summary (Signed)
Attestation of Attending Supervision of Resident: Evaluation and management procedures were performed by the Resident under my supervision and collaboration.  I have seen and examined the patient.  I agree with the Resident's note and Assessment and Plan.  Tarus Briski, DO Attending Physician Faculty Practice, [facility]  

## 2014-06-12 NOTE — Discharge Instructions (Signed)
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
## 2014-06-12 NOTE — Discharge Summary (Signed)
Obstetric Discharge Summary Reason for Admission: onset of labor Prenatal Procedures: none Intrapartum Procedures: spontaneous vaginal delivery Postpartum Procedures: none Complications-Operative and Postpartum: none Hemoglobin  Date Value Ref Range Status  06/11/2014 10.4* 12.0 - 15.0 g/dL Final     HCT  Date Value Ref Range Status  06/11/2014 30.3* 36.0 - 46.0 % Final    Discharge Diagnoses: Term Pregnancy-delivered  Hospital Course:  [name] is a 28 y.o. [id number] who presented with SOL.  She had a uncomplicated SVD. She was able to ambulate, tolerate PO and void normally. She was discharged home with instructions for postpartum care.    Delivery Note PRE-OPERATIVE DIAGNOSIS:  1) [redacted]w[redacted]d pregnancy.  POST-OPERATIVE DIAGNOSIS:  1) [redacted]w[redacted]d pregnancy s/p Vaginal, Spontaneous Delivery  Delivery Type: Vaginal, Spontaneous Delivery  Delivery Clinician: ROBERTS, CAROLINE  Delivery Anesthesia: Epidural  Labor Complications: None  Lacerations: None  ESTIMATED BLOOD LOSS: 250  Labor course: This is a 28 y.o. y.o. female [phone] who came in at [redacted]w[redacted]d pregnancy complaining of contractions. Her prenatal course was complicated by Rh neg status, rubella NI, tobacco abuse, and palpitations. Initial cervical exam was 4/30/-1. She was admitted to L and D. Labor course included:  AROM  Procedure: Vaginal, Spontaneous Delivery  Date of birth: 06/11/2014  Time of birth: 1:48 AM  This [id number] woman under epidural anesthesia delivered a viable female infant weighing Weight: 7 lb 3.2 oz (3.266 kg) (Filed from Delivery Summary) with Apgars as listed below. Delivery was via NSVD  Delivery completed and cord cut and clamped. Infant dried and stimulated. Infant to maternal abdomen. Cord pH not obtained. Active management of the third stage of labor performed. Intact placenta delivered spontaneously at 9/22 1:56 AM . Vagina and cervix explored and no lacerations noted. Uterus well contracted at end of  delivery. Mother and infant tolerated delivery well.  FINDINGS:  1) female infant, Apgar scores of 7 at 1 minute  9 at 5 minutes  2) 3 Vessel Cord  3) Nuchal: no  Physical Exam:  General: alert, cooperative and no distress Lochia: appropriate Uterine Fundus: firm DVT Evaluation: No evidence of DVT seen on physical exam. Negative Homan's sign. No cords or calf tenderness. No significant calf/ankle edema.  Discharge Information: Date: 06/12/2014 Activity: pelvic rest Diet: routine Medications: PNV and Ibuprofen Baby feeding: plans to breastfeed, plans to bottle feed Contraception: IUD Condition: stable Instructions: refer to practice specific booklet Discharge to: home   Newborn Data: Live born female  Birth Weight: 7 lb 3.2 oz (3266 g) APGAR: 7, 9  Home with mother.  Vanessa Puff, MD [facility] FM PGY-1 06/12/2014, 7:12 AM

## 2014-06-12 NOTE — H&P (Signed)
Attestation of Attending Supervision of Obstetric Fellow: Evaluation and management procedures were performed by the Obstetric Fellow under my supervision and collaboration.  I have reviewed the Obstetric Fellow's note and chart, and I agree with the management and plan.  Anari Evitt, DO Attending Physician Faculty Practice, [facility]  

## 2014-06-13 ENCOUNTER — Inpatient Hospital Stay (HOSPITAL_COMMUNITY): Admission: RE | Admit: 2014-06-13 | Payer: Medicaid Other | Source: Ambulatory Visit

## 2014-06-19 ENCOUNTER — Ambulatory Visit: Payer: Medicaid Other | Admitting: Obstetrics and Gynecology

## 2014-06-20 ENCOUNTER — Ambulatory Visit (INDEPENDENT_AMBULATORY_CARE_PROVIDER_SITE_OTHER): Payer: Medicaid Other | Admitting: Obstetrics and Gynecology

## 2014-06-20 ENCOUNTER — Encounter: Payer: Self-pay | Admitting: Obstetrics and Gynecology

## 2014-06-20 ENCOUNTER — Ambulatory Visit (HOSPITAL_COMMUNITY)
Admission: RE | Admit: 2014-06-20 | Discharge: 2014-06-20 | Disposition: A | Discharge status: Home / Self Care | Payer: Medicaid Other | Source: Ambulatory Visit | Attending: Obstetrics and Gynecology | Admitting: Obstetrics and Gynecology

## 2014-06-20 VITALS — BP 110/70 | Ht 63.0 in | Wt 189.0 lb

## 2014-06-20 DIAGNOSIS — R002 Palpitations: Secondary | ICD-10-CM | POA: Insufficient documentation

## 2014-06-20 NOTE — Progress Notes (Signed)
Patient ID: [name], female   DOB: 1985-12-13, 28 y.o.   MRN: [id number]   [facility]  Patient name: [name] MRN [id number]  Date of birth: 1985-12-13  CC & HPI:  Vanessa ChinMegan S [name] is a 28 y.o. female complaining of constant palpitations that started a week ago after she delivered her daughter.  She states that she will feel pressure in her chest with her symptoms.  She states that her symptoms are worse when she lays down.  She has not checked her pulse.  She is breast feeding.  ROS:  All systems are reviewed and are negative unless otherwise specified in the HPI.  Pertinent History Reviewed:   Reviewed: Significant for  Medical         Past Medical History  Diagnosis Date  . Kidney stones   . Kidney infection   . PTSD (post-traumatic stress disorder)   . Depression   . Anxiety                               Surgical Hx:    Past Surgical History  Procedure Laterality Date  . Tonsillectomy    . Anterior cruciate ligament repair  2005  . Cholecystectomy     Medications: Reviewed & Updated - see associated section                      Current outpatient prescriptions:acetaminophen (TYLENOL) 500 MG tablet, Take 500 mg by mouth as needed for moderate pain or headache. , Disp: , Rfl: ;  Prenatal Vit-Fe Fumarate-FA (PRENATAL MULTIVITAMIN) TABS tablet, Take 1 tablet by mouth daily at 12 noon., Disp: , Rfl: ;  ibuprofen (ADVIL,MOTRIN) 600 MG tablet, Take 1 tablet (600 mg total) by mouth every 6 (six) hours as needed for cramping., Disp: 30 tablet, Rfl: 0   Social History: Reviewed -  reports that she has quit smoking. Her smoking use included Cigarettes. She has a 2.5 pack-year smoking history. She has never used smokeless tobacco.  Objective Findings:  Vitals: Blood pressure 110/70, height 5\' 3"  (1.6 m), weight 189 lb (85.73 kg), last menstrual period 08/30/2013, currently breastfeeding.  Physical Examination: Discussion only.   Assessment & Plan:   A:  1.  Palpitations  P:  1. Pt to report to Cardiac rehab at noon today for holter monitor     This chart was scribed for Vanessa BurrowJohn Chey Cho V, MD by Carl Bestelina Holson, ED Scribe. This patient was seen in Room 4 and the patient's care was started at 11:23 AM.

## 2014-06-20 NOTE — Progress Notes (Signed)
Patient ID: [name], female   DOB: Nov 22, 1985, 28 y.o.   MRN: [id number] Pt here today for palpitations after delivery. Pt is 9 days out from delivery and continues having palpitations through out the day and it is worse when she lays down. We will schedule 24 hr holter monitor for abnormal cardiac activity. [facility]

## 2014-06-20 NOTE — Progress Notes (Signed)
24 hr Holter Monitor in progress. 

## 2014-07-03 ENCOUNTER — Telehealth: Payer: Self-pay | Admitting: Obstetrics and Gynecology

## 2014-07-09 ENCOUNTER — Telehealth: Payer: Self-pay | Admitting: Obstetrics and Gynecology

## 2014-07-09 NOTE — Telephone Encounter (Signed)
Phone number of record is disconnected.  Holter records of 06/20/14 not in EPIC at present. Cardiology to research and get back to us if located.

## 2014-07-09 NOTE — Telephone Encounter (Signed)
See my chart message

## 2014-07-22 ENCOUNTER — Ambulatory Visit (INDEPENDENT_AMBULATORY_CARE_PROVIDER_SITE_OTHER): Payer: Medicaid Other | Admitting: Women's Health

## 2014-07-22 ENCOUNTER — Encounter: Payer: Self-pay | Admitting: Women's Health

## 2014-07-22 DIAGNOSIS — Z3009 Encounter for other general counseling and advice on contraception: Secondary | ICD-10-CM

## 2014-07-22 NOTE — Progress Notes (Signed)
Patient ID: Vanessa ChinMegan S Junco, female   DOB: 07/14/1986, 28 y.o.   MRN: [id number] Subjective:    [name] is a 28 y.o. [phone] Caucasian female who presents for a postpartum visit. She is 5 weeks postpartum following a spontaneous vaginal delivery at 40.5 gestational weeks. Anesthesia: epidural. I have fully reviewed the prenatal and intrapartum course. Postpartum course has been complicated by return of palpitations- she had these earlier in pregnancy and had normal holter monitor results. She saw Dr. Emelda FearFerguson on 10/1 and underwent Holter monitoring again which revealed few PACs and symptoms correlating w/ occ sinus bradycardia. Baby's course has been uncomplicated. Baby is feeding by breast, now bottle only. Bleeding no bleeding. Bowel function is normal. Bladder function is normal. Patient is not sexually active. Last sexual activity: prior to birth of baby. Husband is out of town for work. Contraception method is abstinence and wants Liletta IUD. Postpartum depression screening: negative. Score 1.  Last pap 12/2012 and was neg per pt.  The following portions of the patient's history were reviewed and updated as appropriate: allergies, current medications, past medical history, past surgical history and problem list.  Review of Systems Pertinent items are noted in HPI.   Filed Vitals:   07/22/14 1012  BP: 124/72  Height: 5\' 4"  (1.626 m)  Weight: 178 lb (80.74 kg)   No LMP recorded.  Objective:   General:  alert, cooperative and no distress   Breasts:  deferred, no complaints  Lungs: clear to auscultation bilaterally  Heart:  regular rate and rhythm  Abdomen: soft, nontender   Vulva: normal  Vagina: normal vagina  Cervix:  closed  Corpus: Well-involuted  Adnexa:  Non-palpable  Rectal Exam: No hemorrhoids        Assessment:   Postpartum exam 5 wks s/p SVB Bottlefeeding Depression screening Contraception counseling   Plan:  Contraception: abstinence and Liletta placement  1wk Follow up in: 1 week for Liletta insertion or earlier if needed  Marge DuncansBooker, Kimberly Randall CNM, [facility]NP[facility]BC 07/22/2014 10:19 AM

## 2014-07-22 NOTE — Patient Instructions (Signed)
NO SEX UNTIL AFTER LILETTA IUD PLACED

## 2014-07-30 ENCOUNTER — Ambulatory Visit: Payer: Medicaid Other | Admitting: Women's Health

## 2014-08-05 ENCOUNTER — Ambulatory Visit (INDEPENDENT_AMBULATORY_CARE_PROVIDER_SITE_OTHER): Payer: Medicaid Other | Admitting: Women's Health

## 2014-08-05 ENCOUNTER — Encounter: Payer: Self-pay | Admitting: Women's Health

## 2014-08-05 VITALS — BP 108/68 | Ht 63.0 in | Wt 174.0 lb

## 2014-08-05 DIAGNOSIS — Z3202 Encounter for pregnancy test, result negative: Secondary | ICD-10-CM

## 2014-08-05 DIAGNOSIS — Z30430 Encounter for insertion of intrauterine contraceptive device: Secondary | ICD-10-CM

## 2014-08-05 LAB — POCT URINE PREGNANCY: PREG TEST UR: NEGATIVE

## 2014-08-05 NOTE — Progress Notes (Signed)
Patient ID: [name], female   DOB: [birth year], 28 y.o.   MRN: [id number] [name] is a 28 y.o. year old [id number] Caucasian female who presents for placement of a Liletta IUD.  Patient's last menstrual period was 07/24/2014. BP 108/68 mmHg  Ht 5\' 3"  (1.6 m)  Wt 174 lb (78.926 kg)  BMI 30.83 kg/m2  LMP 07/24/2014  Breastfeeding? No Last sexual intercourse was prior to birth of her baby, and pregnancy test today was neg  The risks and benefits of the method and placement have been thouroughly reviewed with the patient and all questions were answered.  Specifically the patient is aware of failure rate of 09/998, expulsion of the IUD and of possible perforation.  The patient is aware of irregular bleeding due to the method and understands the incidence of irregular bleeding diminishes with time.  Signed copy of informed consent in chart.  She understands the [place] Bomb[place]iletta is currently marketed for 3 years, but may be approved for 5 year and possibly 7 year use during the lifetime of her Liletta.  Time out was performed.  A graves speculum was placed in the vagina.  The cervix was visualized, prepped using Betadine, and grasped with a single tooth tenaculum. The uterus was found to be anteroflexed and it sounded to 7 cm.  Liletta IUD placed per manufacturer's recommendations.   The strings were trimmed to 3 cm.  Sonogram was performed and the proper placement of the IUD was verified via transvaginal u/s.   The patient was given post procedure instructions, including signs and symptoms of infection and to check for the strings after each menses or each month, and refraining from intercourse or anything in the vagina for 3 days.  She was given a [place] care card with date Liletta placed, and date Liletta to be removed.  She is scheduled for a f/u appointment in 4 weeks.  Marge DuncansBooker, Deazia Lampi Randall CNM, [place] Alina LodgeWHNP-BC 08/05/2014 2:17 PM

## 2014-08-05 NOTE — Patient Instructions (Signed)
 Nothing in vagina for 3 days (no sex, douching, tampons, etc...)  Check your strings once a month to make sure you can feel them, if you are not able to please let us know  If you develop a fever of 100.4 or more in the next few weeks, or if you develop severe abdominal pain, please let us know  Use a backup method of birth control, such as condoms, for 2 weeks    Levonorgestrel intrauterine device (IUD)- Liletta What is this medicine? LEVONORGESTREL IUD (LEE voe nor jes trel) is a contraceptive (birth control) device. The device is placed inside the uterus by a healthcare professional. It is used to prevent pregnancy and can also be used to treat heavy bleeding that occurs during your period. Depending on the device, it can be used for 3 to 5 years. This medicine may be used for other purposes; ask your health care provider or pharmacist if you have questions. COMMON BRAND NAME(S): LILETTA, Mirena, Skyla What should I tell my health care provider before I take this medicine? They need to know if you have any of these conditions: -abnormal Pap smear -cancer of the breast, uterus, or cervix -diabetes -endometritis -genital or pelvic infection now or in the past -have more than one sexual partner or your partner has more than one partner -heart disease -history of an ectopic or tubal pregnancy -immune system problems -IUD in place -liver disease or tumor -problems with blood clots or take blood-thinners -use intravenous drugs -uterus of unusual shape -vaginal bleeding that has not been explained -an unusual or allergic reaction to levonorgestrel, other hormones, silicone, or polyethylene, medicines, foods, dyes, or preservatives -pregnant or trying to get pregnant -breast-feeding How should I use this medicine? This device is placed inside the uterus by a health care professional. Talk to your pediatrician regarding the use of this medicine in children. Special care may be  needed. Overdosage: If you think you have taken too much of this medicine contact a poison control center or emergency room at once. NOTE: This medicine is only for you. Do not share this medicine with others. What if I miss a dose? This does not apply. What may interact with this medicine? Do not take this medicine with any of the following medications: -amprenavir -bosentan -fosamprenavir This medicine may also interact with the following medications: -aprepitant -barbiturate medicines for inducing sleep or treating seizures -bexarotene -griseofulvin -medicines to treat seizures like carbamazepine, ethotoin, felbamate, oxcarbazepine, phenytoin, topiramate -modafinil -pioglitazone -rifabutin -rifampin -rifapentine -some medicines to treat HIV infection like atazanavir, indinavir, lopinavir, nelfinavir, tipranavir, ritonavir -St. John's wort -warfarin This list may not describe all possible interactions. Give your health care provider a list of all the medicines, herbs, non-prescription drugs, or dietary supplements you use. Also tell them if you smoke, drink alcohol, or use illegal drugs. Some items may interact with your medicine. What should I watch for while using this medicine? Visit your doctor or health care professional for regular check ups. See your doctor if you or your partner has sexual contact with others, becomes HIV positive, or gets a sexual transmitted disease. This product does not protect you against HIV infection (AIDS) or other sexually transmitted diseases. You can check the placement of the IUD yourself by reaching up to the top of your vagina with clean fingers to feel the threads. Do not pull on the threads. It is a good habit to check placement after each menstrual period. Call your doctor right away if   you feel more of the IUD than just the threads or if you cannot feel the threads at all. The IUD may come out by itself. You may become pregnant if the device  comes out. If you notice that the IUD has come out use a backup birth control method like condoms and call your health care provider. Using tampons will not change the position of the IUD and are okay to use during your period. What side effects may I notice from receiving this medicine? Side effects that you should report to your doctor or health care professional as soon as possible: -allergic reactions like skin rash, itching or hives, swelling of the face, lips, or tongue -fever, flu-like symptoms -genital sores -high blood pressure -no menstrual period for 6 weeks during use -pain, swelling, warmth in the leg -pelvic pain or tenderness -severe or sudden headache -signs of pregnancy -stomach cramping -sudden shortness of breath -trouble with balance, talking, or walking -unusual vaginal bleeding, discharge -yellowing of the eyes or skin Side effects that usually do not require medical attention (report to your doctor or health care professional if they continue or are bothersome): -acne -breast pain -change in sex drive or performance -changes in weight -cramping, dizziness, or faintness while the device is being inserted -headache -irregular menstrual bleeding within first 3 to 6 months of use -nausea This list may not describe all possible side effects. Call your doctor for medical advice about side effects. You may report side effects to FDA at 1-800-FDA-1088. Where should I keep my medicine? This does not apply. NOTE: This sheet is a summary. It may not cover all possible information. If you have questions about this medicine, talk to your doctor, pharmacist, or health care provider.  2015, Elsevier/Gold Standard. (2011-10-07 13:54:04)  

## 2014-09-02 ENCOUNTER — Encounter: Payer: Self-pay | Admitting: *Deleted

## 2014-09-02 ENCOUNTER — Ambulatory Visit: Payer: Medicaid Other | Admitting: Women's Health

## 2015-09-26 ENCOUNTER — Ambulatory Visit (INDEPENDENT_AMBULATORY_CARE_PROVIDER_SITE_OTHER): Payer: Medicaid Other | Admitting: Neurology

## 2015-09-26 DIAGNOSIS — R55 Syncope and collapse: Secondary | ICD-10-CM | POA: Diagnosis not present

## 2015-09-26 NOTE — Procedures (Signed)
    History:  Vanessa DarterMegan Frymire is a 30 year old patient with a history of episodes of syncope that began about one month ago, the patient has had 2 other episodes, and she is having some headaches. The patient is being evaluated for these events.   This is a routine EEG. No skull defects are noted. Medications include Zyrtec.   EEG classification: Normal awake and drowsy  Description of the recording: The background rhythms of this recording consists of a fairly well modulated medium amplitude alpha rhythm of 9 Hz that is reactive to eye opening and closure. As the record progresses, the patient appears to remain in the waking state throughout the recording. Photic stimulation was performed, resulting in a bilateral and symmetric photic driving response. Hyperventilation was also performed, resulting in a minimal buildup of the background rhythm activities without significant slowing seen. Toward the end of the recording, the patient enters the drowsy state with slight symmetric slowing seen. The patient never enters stage II sleep. At no time during the recording does there appear to be evidence of spike or spike wave discharges or evidence of focal slowing. EKG monitor shows no evidence of cardiac rhythm abnormalities with a heart rate of 72.  Impression: This is a normal EEG recording in the waking and drowsy state. No evidence of ictal or interictal discharges are seen.

## 2015-10-14 ENCOUNTER — Encounter: Payer: Self-pay | Admitting: Neurology

## 2015-10-14 ENCOUNTER — Other Ambulatory Visit: Payer: Self-pay

## 2015-10-14 ENCOUNTER — Ambulatory Visit (INDEPENDENT_AMBULATORY_CARE_PROVIDER_SITE_OTHER): Payer: Medicaid Other | Admitting: Neurology

## 2015-10-14 VITALS — BP 116/76 | HR 91 | Ht 63.0 in | Wt 186.6 lb

## 2015-10-14 DIAGNOSIS — Z720 Tobacco use: Secondary | ICD-10-CM

## 2015-10-14 DIAGNOSIS — R51 Headache: Secondary | ICD-10-CM | POA: Diagnosis not present

## 2015-10-14 DIAGNOSIS — G44009 Cluster headache syndrome, unspecified, not intractable: Secondary | ICD-10-CM

## 2015-10-14 DIAGNOSIS — R519 Headache, unspecified: Secondary | ICD-10-CM

## 2015-10-14 DIAGNOSIS — R42 Dizziness and giddiness: Secondary | ICD-10-CM | POA: Diagnosis not present

## 2015-10-14 DIAGNOSIS — F17200 Nicotine dependence, unspecified, uncomplicated: Secondary | ICD-10-CM

## 2015-10-14 NOTE — Progress Notes (Signed)
NEUROLOGY CLINIC NEW PATIENT NOTE  NAME: [name] DOB: 1986-08-26 REFERRING PHYSICIAN: Selinda Flavin, MD  I saw [name] as a new consult in the neurovascular clinic today regarding  Chief Complaint  Patient presents with  . New Patient (Initial Visit)    headaches and abnormal MRI  .  HPI: [name] is a 30 y.o. female with PMH of smoker who presents as a new patient for Headache, dizziness, and episode of LOC.   Patient stated that she started to have headache for the last 3-4 months. The headache is on the right side back of her head, constant, 3-4/10 severity, however, it can go to 7-8/10 intermittently. Last Friday, she also felt right frontal soreness throbbing pain. She stated that bending over, coughing, sneezing make HA worse, throbbing pain with explosion feedings. She denies any vision changes, had ophthalmology visit in September 2016, was told everything was good. She takes ibuprofen but ineffective. In the past, she also had mild headache 3-4/10, all over the head, associate with skipping meals, sinus headache, not associated with periods, lasting 1-2 hours, ibuprofen ineffective.  She also complained of dizziness, not vertigo, feels more lightheadedness, wobbly walking and sometimes walking sideways. It happens several times a week, can last 2 minutes or woke up with dizziness and last all day long.  She also stated that she had a 3 episodes of LOC. Before losing consciousness, she felt dizzy, nauseous, dry hive, feeling of about 2 passing out, then loss of consciousness. When she woke up, she started to hear sound from distance and blurry vision. And then she will hear several "pop" sounds in ears, and then she will be able to hear and see, quickly back to baseline. Denies any postictal confusion, agitation or drowsiness. She said her husband told her that during the time she lost her consciousness she had no shaking jerking and not responding for voice. Episodes  lasting around 2-5 minutes. First time it happened once she is going to watch a movie in the bedroom. Second time it happened when she was sitting on the floor playing with her kids. Third time it happened when she was washing dishes.   Had EEG done on 09/26/2015 which was normal EEG. She also had MRI and MRA done on generally tense 2017 showed abnormal flare signal within the left cortical vein of Labbe. It raises concern for slow flow, but no evidence of venous infarction. Venous thrombosis is not confirmed on the GRE sequence. Otherwise negative MRI of the brain. 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  Patient denies any other past medical history, however she is smoker 4-5 cigarettes per day. Denies alcohol or illicit drugs. She is using IUD [place] for birth control, which is progesterone-based. She denies any recent stress or anxiety.    Past Medical History  Diagnosis Date  . Kidney stones   . Kidney infection   . PTSD (post-traumatic stress disorder)   . Depression   . Anxiety    Past Surgical History  Procedure Laterality Date  . Tonsillectomy    . Anterior cruciate ligament repair  2005  . Cholecystectomy     Family History  Problem Relation Age of Onset  . Hyperlipidemia Neg Hx   . Stroke Neg Hx   . Cancer Mother 61  breast  . Chiari malformation Brother   . Hypertension Maternal Grandmother   . Heart disease Maternal Grandfather   . Diabetes Maternal Grandfather   . Heart failure Maternal Grandfather   . Kidney disease Maternal Grandfather   . Hypertension Paternal Grandmother    Current Outpatient Prescriptions  Medication Sig Dispense Refill  . ibuprofen (ADVIL,MOTRIN) 600 MG tablet Take 1 tablet (600 mg total) by mouth every 6 (six) hours as needed for cramping. 30 tablet 0   No  current facility-administered medications for this visit.   Allergies  Allergen Reactions  . Phenergan [Promethazine] Rash and Other (See Comments)    REACTION unknown (nerve difficulty)  . Tramadol Rash   Social History   Social History  . Marital Status: Married    Spouse Name: N/A  . Number of Children: N/A  . Years of Education: N/A   Occupational History  . Not on file.   Social History Main Topics  . Smoking status: Current Some Day Smoker -- 0.25 packs/day for 10 years    Types: Cigarettes  . Smokeless tobacco: Never Used  . Alcohol Use: No     Comment: rarely  . Drug Use: No  . Sexual Activity: Not Currently    Birth Control/ Protection: None   Other Topics Concern  . Not on file   Social History Narrative    Review of Systems Full 14 system review of systems performed and notable only for those listed, all others are neg:  Constitutional:  Fatigue Cardiovascular:  Ear/Nose/Throat:   Skin:  Eyes:  Eye pain Respiratory:   Gastroitestinal:   Genitourinary:  Hematology/Lymphatic:   Endocrine:  Musculoskeletal:   Allergy/Immunology:   Neurological:  Headache, dizziness, passing out Psychiatric: Decreased energy Sleep:    Physical Exam  Filed Vitals:   10/14/15 0944  BP: 116/76  Pulse: 91    General - mildly obese, well developed, in no apparent distress.  Ophthalmologic - Sharp disc margins OU at temporal side, However, at nasal sides bilaterally, disc margin becomes  blurry.  Cardiovascular - Regular rate and rhythm with no murmur. Carotid pulses were 2+ without bruits .   Neck - supple, no nuchal rigidity .  Mental Status -  Level of arousal and orientation to time, place, and person were intact. Language including expression, naming, repetition, comprehension, reading, and writing was assessed and found intact. Attention span and concentration were normal. Recent and remote memory were intact. Fund of Knowledge was assessed and was  intact.  Cranial Nerves II - XII - II - Visual field intact OU. III, IV, VI - Extraocular movements intact. V - Facial sensation intact bilaterally. VII - Facial movement intact bilaterally. VIII - Hearing & vestibular intact bilaterally. X - Palate elevates symmetrically. XI - [name] turning & shoulder shrug intact bilaterally. XII - Tongue protrusion intact.  Motor Strength - The patient's strength was normal in all extremities and pronator drift was absent.  Bulk was normal and fasciculations were absent.   Motor Tone - Muscle tone was assessed at the neck and appendages and was normal.  Reflexes - The patient's reflexes were normal in all extremities and she had no pathological reflexes  Sensory - Light touch, temperature/pinprick, vibration and proprioception, and Romberg testing were assessed and were normal.    Coordination - The patient had normal movements in the hands and feet with no ataxia or dysmetria.  Tremor was absent.  Gait and Station - The patient's transfers, posture, gait, station, and turns  were observed as normal.   Imaging  I have personally reviewed the radiological images below and agree with the radiology interpretations.  MRI brain 09/30/15 -  1. Abnormal flare signal within the left cortical vein of Labbe. This raises concern for slow flow. There is no evidence for venous infarction. Venous thrombosis is not confirmed on the gradient sequence. This may be artifactual. Recommend follow-up MRV to further evaluate possible thrombosis. 2. Otherwise negative MRI of the brain.  MRA head 10/03/15 1. Stenosis of the left transverse sinus. This may be related to idiopathic intracranial hypertension. Correlation with papilledema or lumbar puncture may be useful. 2. Heterogenous signal within the left vein of Labbe. This may be flow related, associated with increased flow distal to the stenosis of the transverse sinus.  Lab Review Pregnancy test, urine -  negative.    Assessment and Plan:   In summary, [name] is a 29 y.o. female with PMH of current smoker presents headache, dizziness, episodes of LOC. MRI of the brain and MRA of the head concerning for cerebral venous thrombosis. EEG negative for seizure. Patient has features of seizure/LOC, HA pattern concerning for pseudotumor cerebri, IUD and smoking hx for CVT, however, presentation atypical for either of them. Will do CTV to further evaluate venous thrombosis, and lumbar puncture to rule out pseudotumor cerebri.  - will do CTV to rule out CVT - will do LP to check the opening pressure - depends on the result, will develop management plan - request CD from [facility] hospital - follow up in 2 months.  Thank you very much for the opportunity to participate in the care of this patient.  Please do not hesitate to call if any questions or concerns arise.  Orders Placed This Encounter  Procedures  . CT Angio Head W/Cm &/Or Wo Cm    Please do CT venogram to rule out venous thrombosis. Thanks. MEDICAID  NO NEEDS  CR/PT  NOT ALLERGIC TO CONTRAST NO HTN OR DIABETIC EPIC ORDER    Standing Status: Future     Number of Occurrences:      Standing Expiration Date: 12/11/2016    Scheduling Instructions:     Please schedule urgent to be done within 3-5 days.    Order Specific Question:  If indicated for the ordered procedure, I authorize the administration of contrast media per Radiology protocol    Answer:  Yes    Order Specific Question:  Reason for Exam (SYMPTOM  OR DIAGNOSIS REQUIRED)    Answer:  HA with MRI concerning for CVT    Order Specific Question:  Is the patient pregnant?    Answer:  No    Order Specific Question:  Preferred imaging location?    Answer:  Internal  . DG FLUORO GUIDED LOC OF NEEDLE/CATH TIP FOR SPINAL INJECT LT    LP W/LABS CK OPENING PRESSURE  MCD  PACS  [id number]  NO THINNERS  NEW    Standing Status: Future     Number of Occurrences:      Standing Expiration  Date: 12/12/2016    Scheduling Instructions:     Pt urine pregnancy test negative and had IUD placement. Please schedule ASAP.    Order Specific Question:  Lab orders requested (DO NOT place separate lab orders, these will be automatically ordered during procedure specimen collection):    Answer:  CSF cell count w differential    Order Specific Question:  Lab orders requested (DO NOT place separate lab orders, these  will be automatically ordered during procedure specimen collection):    Answer:  CSF culture    Order Specific Question:  Lab orders requested (DO NOT place separate lab orders, these will be automatically ordered during procedure specimen collection):    Answer:  Gram stain    Order Specific Question:  Lab orders requested (DO NOT place separate lab orders, these will be automatically ordered during procedure specimen collection):    Answer:  Protein and glucose, CSF    Order Specific Question:  Lab orders requested (DO NOT place separate lab orders, these will be automatically ordered during procedure specimen collection):    Answer:  VDRL, CSF    Order Specific Question:  Reason for Exam (SYMPTOM  OR DIAGNOSIS REQUIRED)    Answer:  headache to rule out pseudotumor cerebri    Order Specific Question:  Is the patient pregnant?    Answer:  No    Order Specific Question:  Preferred imaging location?    Answer:  Internal    No orders of the defined types were placed in this encounter.    Patient Instructions  - will do CT venogram to rule out venous clot - will do spinal tap to check the pressure in the brain - depends on the result, will develop management plan - will do urine pregnancy test to rule out pregnancy - will contact you again with the result - request CD from [facility] hospital and bring to [place] for review. - follow up in 2 months.    Marvel Plan, MD PhD [facility] [address], [address] [place], [place] [address] [phone]

## 2015-10-14 NOTE — Patient Instructions (Signed)
-   will do CT venogram to rule out venous clot - will do spinal tap to check the pressure in the brain - depends on the result, will develop management plan - will do urine pregnancy test to rule out pregnancy - will contact you again with the result - request CD from [facility] hospital and bring to [place] for review. - follow up in 2 months.

## 2015-10-15 ENCOUNTER — Other Ambulatory Visit: Payer: Self-pay

## 2015-10-15 DIAGNOSIS — R42 Dizziness and giddiness: Secondary | ICD-10-CM | POA: Insufficient documentation

## 2015-10-15 LAB — PREGNANCY, URINE: Preg Test, Ur: NEGATIVE

## 2015-10-16 ENCOUNTER — Telehealth: Payer: Self-pay

## 2015-10-16 NOTE — Telephone Encounter (Signed)
Rn call [facility] at [phone] Rn talk to [place] in radiology and requested copies of patients MRI and MRA  Images CD. Rn gave patients name and address and MD name so it can be sent in the mail.

## 2015-10-21 ENCOUNTER — Other Ambulatory Visit: Payer: Medicaid Other

## 2015-10-28 ENCOUNTER — Inpatient Hospital Stay
Admission: RE | Admit: 2015-10-28 | Discharge: 2015-10-28 | Disposition: A | Discharge status: Home / Self Care | Payer: Medicaid Other | Source: Ambulatory Visit | Attending: Neurology | Admitting: Neurology

## 2015-10-28 ENCOUNTER — Inpatient Hospital Stay: Admission: RE | Admit: 2015-10-28 | Payer: Medicaid Other | Source: Ambulatory Visit

## 2015-10-28 NOTE — Discharge Instructions (Signed)

## 2015-12-02 ENCOUNTER — Ambulatory Visit
Admission: RE | Admit: 2015-12-02 | Discharge: 2015-12-02 | Disposition: A | Discharge status: Home / Self Care | Payer: Medicaid Other | Source: Ambulatory Visit | Attending: Neurology | Admitting: Neurology

## 2015-12-02 DIAGNOSIS — R51 Headache: Principal | ICD-10-CM

## 2015-12-02 DIAGNOSIS — R519 Headache, unspecified: Secondary | ICD-10-CM

## 2015-12-02 MED ORDER — IOPAMIDOL (ISOVUE-300) INJECTION 61%
75.0000 mL | Freq: Once | INTRAVENOUS | Status: AC | PRN
  Administered 2015-12-02: 75 mL via INTRAVENOUS

## 2015-12-11 ENCOUNTER — Ambulatory Visit: Payer: Medicaid Other | Admitting: Neurology

## 2015-12-26 ENCOUNTER — Ambulatory Visit: Payer: Medicaid Other | Admitting: Neurology

## 2017-05-10 ENCOUNTER — Emergency Department (HOSPITAL_COMMUNITY): Payer: Self-pay

## 2017-05-10 ENCOUNTER — Encounter (HOSPITAL_COMMUNITY): Payer: Self-pay

## 2017-05-10 ENCOUNTER — Emergency Department (HOSPITAL_COMMUNITY)
Admission: EM | Admit: 2017-05-10 | Discharge: 2017-05-10 | Disposition: A | Discharge status: Home / Self Care | Payer: Self-pay | Attending: Emergency Medicine | Admitting: Emergency Medicine

## 2017-05-10 DIAGNOSIS — R51 Headache: Secondary | ICD-10-CM | POA: Insufficient documentation

## 2017-05-10 DIAGNOSIS — F17210 Nicotine dependence, cigarettes, uncomplicated: Secondary | ICD-10-CM | POA: Insufficient documentation

## 2017-05-10 DIAGNOSIS — R4020 Unspecified coma: Secondary | ICD-10-CM | POA: Insufficient documentation

## 2017-05-10 LAB — I-STAT BETA HCG BLOOD, ED (MC, WL, AP ONLY): I-stat hCG, quantitative: <5 m[IU]/mL (ref <5)

## 2017-05-10 LAB — CBC WITH DIFFERENTIAL/PLATELET
BASOS ABS: 0.1 10*3/uL (ref 0.0–0.1)
BASOS PCT: 0 %
Eosinophils Absolute: 0.3 10*3/uL (ref 0.0–0.7)
Eosinophils Relative: 2 %
HEMATOCRIT: 42.2 % (ref 36.0–46.0)
HEMOGLOBIN: 14.7 g/dL (ref 12.0–15.0)
LYMPHS PCT: 23 %
Lymphs Abs: 3.5 10*3/uL (ref 0.7–4.0)
MCH: 30.5 pg (ref 26.0–34.0)
MCHC: 34.8 g/dL (ref 30.0–36.0)
MCV: 87.6 fL (ref 78.0–100.0)
MONO ABS: 0.8 10*3/uL (ref 0.1–1.0)
Monocytes Relative: 5 %
NEUTROS ABS: 10.6 10*3/uL — AB (ref 1.7–7.7)
NEUTROS PCT: 70 %
Platelets: 294 10*3/uL (ref 150–400)
RBC: 4.82 MIL/uL (ref 3.87–5.11)
RDW: 12.7 % (ref 11.5–15.5)
WBC: 15.2 10*3/uL — AB (ref 4.0–10.5)

## 2017-05-10 LAB — COMPREHENSIVE METABOLIC PANEL
ALT: 16 U/L (ref 14–54)
ANION GAP: 8 (ref 5–15)
AST: 18 U/L (ref 15–41)
Albumin: 4 g/dL (ref 3.5–5.0)
Alkaline Phosphatase: 86 U/L (ref 38–126)
BILIRUBIN TOTAL: 0.4 mg/dL (ref 0.3–1.2)
BUN: 12 mg/dL (ref 6–20)
CHLORIDE: 106 mmol/L (ref 101–111)
CO2: 24 mmol/L (ref 22–32)
Calcium: 9.5 mg/dL (ref 8.9–10.3)
Creatinine, Ser: 0.68 mg/dL (ref 0.44–1.00)
GFR calc Af Amer: >60 mL/min (ref >60)
Glucose, Bld: 130 mg/dL — ABNORMAL HIGH (ref 65–99)
POTASSIUM: 3.5 mmol/L (ref 3.5–5.1)
Sodium: 138 mmol/L (ref 135–145)
TOTAL PROTEIN: 7.3 g/dL (ref 6.5–8.1)

## 2017-05-10 LAB — CBG MONITORING, ED: GLUCOSE-CAPILLARY: 150 mg/dL — AB (ref 65–99)

## 2017-05-10 LAB — TROPONIN I

## 2017-05-10 MED ORDER — KETOROLAC TROMETHAMINE 30 MG/ML IJ SOLN
15.0000 mg | Freq: Once | INTRAMUSCULAR | Status: AC
  Administered 2017-05-10: 15 mg via INTRAVENOUS
  Filled 2017-05-10: qty 1

## 2017-05-10 MED ORDER — SODIUM CHLORIDE 0.9 % IV SOLN
INTRAVENOUS | Status: DC
  Administered 2017-05-10: 1000 mL via INTRAVENOUS

## 2017-05-10 MED ORDER — METOCLOPRAMIDE HCL 5 MG/ML IJ SOLN
10.0000 mg | Freq: Once | INTRAMUSCULAR | Status: AC
  Administered 2017-05-10: 10 mg via INTRAVENOUS
  Filled 2017-05-10: qty 2

## 2017-05-10 MED ORDER — DIPHENHYDRAMINE HCL 50 MG/ML IJ SOLN
25.0000 mg | Freq: Once | INTRAMUSCULAR | Status: AC
  Administered 2017-05-10: 25 mg via INTRAVENOUS
  Filled 2017-05-10: qty 1

## 2017-05-10 NOTE — Discharge Instructions (Signed)
As discussed, your evaluation today has been largely reassuring.  But, it is important that you monitor your condition carefully, and do not hesitate to return to the ED if you develop new, or concerning changes in your condition. ? ?Otherwise, please follow-up with your physician for appropriate ongoing care. ? ?

## 2017-05-10 NOTE — ED Notes (Signed)
Pt alert & oriented x4, stable gait. Patient given discharge instructions, paperwork & prescription(s). Patient  instructed to stop at the registration desk to finish any additional paperwork. Patient verbalized understanding. Pt left department w/ no further questions. 

## 2017-05-10 NOTE — ED Provider Notes (Signed)
[facility]-EMERGENCY DEPT Provider Note   CSN: [id number] Arrival date & time: 05/10/17  1823     History   Chief Complaint Chief Complaint  Patient presents with  . Loss of Consciousness    HPI [name] is a 31 y.o. female.  HPI  Patient presents after an episode of loss of consciousness per Patient notes that she has had 3 or 4 similar events, and this was a characteristically similar episode. She is here with a companion who corroborates the history. She has earlier today, after poorly telling her daughter that she felt lightheaded she had a period of no recall with likely fall onto her right side. She was unconscious for several moments, had a period of hypersomnolence upon awakening, but no overt confusion, and no pain either before or after the event. It was witnessed by her companion. Patient denies focal pain in any area, states that she is sore throughout her right side. She may have fallen onto a laundry basket. Notably, the patient has been seen, evaluated for these episodes with multiple practitioners including emergency department, primary care, neurology 1. Patient has had EEG without evidence for seizure, and MRI was reported narrowing of one of the vessels.   Patient also acknowledges history of migraines, states that she is having a typical headache episode currently.  Pain is severe, right-sided, sore.  Past Medical History:  Diagnosis Date  . Anxiety   . Depression   . Kidney infection   . Kidney stones   . PTSD (post-traumatic stress disorder)     Patient Active Problem List   Diagnosis Date Noted  . Dizziness and giddiness 10/15/2015  . Headache 10/14/2015  . Syncope and collapse 09/26/2015  . Rapid palpitations 01/16/2014  . Smoker 12/26/2013  . Rh negative state in antepartum period 10/31/2013    Past Surgical History:  Procedure Laterality Date  . ANTERIOR CRUCIATE LIGAMENT REPAIR  2005  . CHOLECYSTECTOMY    . TONSILLECTOMY      OB  History    Gravida Para Term Preterm AB Living   7 3 3  0 4 3   SAB TAB Ectopic Multiple Live Births   4 0 0 0 3       Home Medications    Prior to Admission medications   Medication Sig Start Date End Date Taking? Authorizing Provider  ibuprofen (ADVIL,MOTRIN) 600 MG tablet Take 1 tablet (600 mg total) by mouth every 6 (six) hours as needed for cramping. 06/12/14   [name], MD    Family History Family History  Problem Relation Age of Onset  . Cancer Mother 8       breast  . Chiari malformation Brother   . Hypertension Maternal Grandmother   . Heart disease Maternal Grandfather   . Diabetes Maternal Grandfather   . Heart failure Maternal Grandfather   . Kidney disease Maternal Grandfather   . Hypertension Paternal Grandmother   . Hyperlipidemia Neg Hx   . Stroke Neg Hx     Social History Social History  Substance Use Topics  . Smoking status: Current Some Day Smoker    Packs/day: 0.25    Years: 10.00    Types: Cigarettes  . Smokeless tobacco: Never Used  . Alcohol use No     Comment: rarely     Allergies   Phenergan [promethazine] and Tramadol   Review of Systems Review of Systems  Constitutional:       Per HPI, otherwise negative  HENT:  Per HPI, otherwise negative  Respiratory:       Per HPI, otherwise negative  Cardiovascular:       Per HPI, otherwise negative  Gastrointestinal: Negative for vomiting.  Endocrine:       Negative aside from HPI  Genitourinary:       Neg aside from HPI   Musculoskeletal:       Per HPI, otherwise negative  Skin: Negative.   Neurological: Positive for syncope and headaches.     Physical Exam Updated Vital Signs BP 124/77   Pulse 98   Temp 98.6 F (37 C) (Oral)   Resp 16   SpO2 97%   Physical Exam  Constitutional: She is oriented to person, place, and time. She appears well-developed and well-nourished. No distress.  HENT:  Head: Normocephalic and atraumatic.  Eyes: Conjunctivae and EOM  are normal.  Cardiovascular: Normal rate and regular rhythm.   Pulmonary/Chest: Effort normal and breath sounds normal. No stridor. No respiratory distress.  Abdominal: She exhibits no distension.  Musculoskeletal: She exhibits no edema.  Neurological: She is alert and oriented to person, place, and time. She displays no atrophy and no tremor. No cranial nerve deficit. She exhibits normal muscle tone. She displays no seizure activity. Coordination normal.  Skin: Skin is warm and dry.  Psychiatric: She has a normal mood and affect.  Nursing note and vitals reviewed.    ED Treatments / Results  Labs (all labs ordered are listed, but only abnormal results are displayed) Labs Reviewed  COMPREHENSIVE METABOLIC PANEL - Abnormal; Notable for the following:       Result Value   Glucose, Bld 130 (*)    All other components within normal limits  CBC WITH DIFFERENTIAL/PLATELET - Abnormal; Notable for the following:    WBC 15.2 (*)    Neutro Abs 10.6 (*)    All other components within normal limits  CBG MONITORING, ED - Abnormal; Notable for the following:    Glucose-Capillary 150 (*)    All other components within normal limits  TROPONIN I  I-STAT BETA HCG BLOOD, ED ([facility], [facility], [facility] ONLY)    EKG  EKG Interpretation  Date/Time:  Tuesday May 10 2017 18:39:21 EDT Ventricular Rate:  94 PR Interval:    QRS Duration: 94 QT Interval:  334 QTC Calculation: 418 R Axis:   54 Text Interpretation:  Sinus rhythm Artifact Abnormal ekg Confirmed by Gerhard Munch [phone]) on 05/10/2017 6:46:19 PM       Radiology Dg Chest 2 View  Result Date: 05/10/2017 CLINICAL DATA:  Syncope EXAM: CHEST  2 VIEW COMPARISON:  September 02, 2012 FINDINGS: Lungs are clear. Heart size and pulmonary vascularity are normal. No adenopathy. No pneumothorax. No bone lesions. IMPRESSION: No edema or consolidation. Electronically Signed   By: Bretta Bang III M.D.   On: 05/10/2017 19:10    Procedures Procedures  (including critical care time)  Medications Ordered in ED Medications  0.9 %  sodium chloride infusion (1,000 mLs Intravenous New Bag/Given 05/10/17 1946)  ketorolac (TORADOL) 30 MG/ML injection 15 mg (not administered)  metoCLOPramide (REGLAN) injection 10 mg (not administered)  diphenhydrAMINE (BENADRYL) injection 25 mg (not administered)     Initial Impression / Assessment and Plan / ED Course  I have reviewed the triage vital signs and the nursing notes.  Pertinent labs & imaging results that were available during my care of the patient were reviewed by me and considered in my medical decision making (see chart for details). I reviewed  the patient's EMR including prior CT imaging from this facility with no evidence for thrombosis. Per patient's report she also had MRI as an patient had another facility with no stroke, but with narrowing of 1 intracranial vessel. This study is not available.   Update: On repeat exam the patient is in no distress, headache has resolved, she has no ongoing complaints, cardiac monitor she has no arrhythmia. With her companion we discussed reassuring lab results, absence of ongoing complaints, need for continued evaluation as an outpatient. Provider resources to obtain follow-up with her neurologist within a week.   This young female presents after an episode of change in consciousness. Patient's description is not consistent definitively with syncope or seizure, though the absence of a characteristic postictal phase makes syncope more likely. Patient has been evaluated by multiple physicians multiple times, and requires additional ongoing monitoring, management. Here, however, with no arrhythmia, no distress, no ongoing symptoms, no neurologic dysfunction, she is appropriate for discharge, with close outpatient follow-up.   Final Clinical Impressions(s) / ED Diagnoses   Final diagnoses:  Loss of consciousness [facility])     Gerhard Munch,  MD 05/10/17 2142

## 2017-05-10 NOTE — ED Triage Notes (Signed)
?   Syncopal episode.  Found laying in floor today unresponsive.  Pt states she doesn't remember what happened.  Only complaint now is feeling tired.

## 2020-08-24 ENCOUNTER — Encounter (HOSPITAL_COMMUNITY): Payer: Self-pay | Admitting: Emergency Medicine

## 2020-08-24 ENCOUNTER — Emergency Department (HOSPITAL_COMMUNITY): Payer: 59

## 2020-08-24 ENCOUNTER — Observation Stay (HOSPITAL_COMMUNITY)
Admission: EM | Admit: 2020-08-24 | Discharge: 2020-08-25 | Disposition: A | Discharge status: Home / Self Care | Payer: 59 | Attending: Physician Assistant | Admitting: Physician Assistant

## 2020-08-24 ENCOUNTER — Other Ambulatory Visit: Payer: Self-pay

## 2020-08-24 DIAGNOSIS — R109 Unspecified abdominal pain: Secondary | ICD-10-CM | POA: Diagnosis present

## 2020-08-24 DIAGNOSIS — K353 Acute appendicitis with localized peritonitis, without perforation or gangrene: Secondary | ICD-10-CM

## 2020-08-24 DIAGNOSIS — Z20822 Contact with and (suspected) exposure to covid-19: Secondary | ICD-10-CM | POA: Insufficient documentation

## 2020-08-24 DIAGNOSIS — K3531 Acute appendicitis with localized peritonitis and gangrene, without perforation: Principal | ICD-10-CM | POA: Insufficient documentation

## 2020-08-24 DIAGNOSIS — K3580 Unspecified acute appendicitis: Secondary | ICD-10-CM | POA: Diagnosis present

## 2020-08-24 DIAGNOSIS — F17210 Nicotine dependence, cigarettes, uncomplicated: Secondary | ICD-10-CM | POA: Diagnosis not present

## 2020-08-24 LAB — URINALYSIS, ROUTINE W REFLEX MICROSCOPIC
Bilirubin Urine: NEGATIVE
Glucose, UA: NEGATIVE mg/dL
Ketones, ur: NEGATIVE mg/dL
Nitrite: NEGATIVE
Protein, ur: NEGATIVE mg/dL
Specific Gravity, Urine: 1.017 (ref 1.005–1.030)
pH: 5 (ref 5.0–8.0)

## 2020-08-24 LAB — CBC
HCT: 40.5 % (ref 36.0–46.0)
Hemoglobin: 14 g/dL (ref 12.0–15.0)
MCH: 30.8 pg (ref 26.0–34.0)
MCHC: 34.6 g/dL (ref 30.0–36.0)
MCV: 89.2 fL (ref 80.0–100.0)
Platelets: 273 10*3/uL (ref 150–400)
RBC: 4.54 MIL/uL (ref 3.87–5.11)
RDW: 12.6 % (ref 11.5–15.5)
WBC: 26.3 10*3/uL — ABNORMAL HIGH (ref 4.0–10.5)
nRBC: 0 % (ref 0.0–0.2)

## 2020-08-24 LAB — COMPREHENSIVE METABOLIC PANEL
ALT: 21 U/L (ref 0–44)
AST: 18 U/L (ref 15–41)
Albumin: 3.7 g/dL (ref 3.5–5.0)
Alkaline Phosphatase: 94 U/L (ref 38–126)
Anion gap: 12 (ref 5–15)
BUN: 9 mg/dL (ref 6–20)
CO2: 22 mmol/L (ref 22–32)
Calcium: 9.6 mg/dL (ref 8.9–10.3)
Chloride: 101 mmol/L (ref 98–111)
Creatinine, Ser: 0.64 mg/dL (ref 0.44–1.00)
GFR, Estimated: >60 mL/min (ref >60)
Glucose, Bld: 108 mg/dL — ABNORMAL HIGH (ref 70–99)
Potassium: 4 mmol/L (ref 3.5–5.1)
Sodium: 135 mmol/L (ref 135–145)
Total Bilirubin: 0.8 mg/dL (ref 0.3–1.2)
Total Protein: 6.8 g/dL (ref 6.5–8.1)

## 2020-08-24 LAB — LIPASE, BLOOD: Lipase: 21 U/L (ref 11–51)

## 2020-08-24 LAB — I-STAT BETA HCG BLOOD, ED (MC, WL, AP ONLY): I-stat hCG, quantitative: <5 m[IU]/mL (ref <5)

## 2020-08-24 LAB — RESP PANEL BY RT-PCR (FLU A&B, COVID) ARPGX2
Influenza A by PCR: NEGATIVE
Influenza B by PCR: NEGATIVE
SARS Coronavirus 2 by RT PCR: NEGATIVE

## 2020-08-24 MED ORDER — IOHEXOL 300 MG/ML  SOLN
100.0000 mL | Freq: Once | INTRAMUSCULAR | Status: AC | PRN
  Administered 2020-08-24: 100 mL via INTRAVENOUS

## 2020-08-24 MED ORDER — PIPERACILLIN-TAZOBACTAM 3.375 G IVPB
3.3750 g | Freq: Three times a day (TID) | INTRAVENOUS | Status: DC
  Administered 2020-08-25 (×2): 3.375 g via INTRAVENOUS
  Filled 2020-08-24 (×2): qty 50

## 2020-08-24 MED ORDER — KCL IN DEXTROSE-NACL 20-5-0.45 MEQ/L-%-% IV SOLN
INTRAVENOUS | Status: DC
  Filled 2020-08-24: qty 1000

## 2020-08-24 MED ORDER — SODIUM CHLORIDE 0.9 % IV BOLUS
1000.0000 mL | Freq: Once | INTRAVENOUS | Status: AC
  Administered 2020-08-24: 1000 mL via INTRAVENOUS

## 2020-08-24 MED ORDER — DIPHENHYDRAMINE HCL 12.5 MG/5ML PO ELIX: 12.5000 mg | ORAL_SOLUTION | Freq: Four times a day (QID) | ORAL | Status: DC | PRN

## 2020-08-24 MED ORDER — ONDANSETRON HCL 4 MG/2ML IJ SOLN
4.0000 mg | Freq: Once | INTRAMUSCULAR | Status: AC
  Administered 2020-08-24: 4 mg via INTRAVENOUS
  Filled 2020-08-24: qty 2

## 2020-08-24 MED ORDER — METOCLOPRAMIDE HCL 5 MG/ML IJ SOLN: 10.0000 mg | Freq: Three times a day (TID) | INTRAMUSCULAR | Status: DC | PRN

## 2020-08-24 MED ORDER — ONDANSETRON 4 MG PO TBDP: 4.0000 mg | ORAL_TABLET | Freq: Four times a day (QID) | ORAL | Status: DC | PRN

## 2020-08-24 MED ORDER — FENTANYL CITRATE (PF) 100 MCG/2ML IJ SOLN: 25.0000 ug | INTRAMUSCULAR | Status: DC | PRN

## 2020-08-24 MED ORDER — KETOROLAC TROMETHAMINE 30 MG/ML IJ SOLN
30.0000 mg | Freq: Three times a day (TID) | INTRAMUSCULAR | Status: DC
  Administered 2020-08-24 – 2020-08-25 (×3): 30 mg via INTRAVENOUS
  Filled 2020-08-24 (×3): qty 1

## 2020-08-24 MED ORDER — PIPERACILLIN-TAZOBACTAM 3.375 G IVPB 30 MIN
3.3750 g | Freq: Once | INTRAVENOUS | Status: AC
  Administered 2020-08-24: 3.375 g via INTRAVENOUS
  Filled 2020-08-24: qty 50

## 2020-08-24 MED ORDER — ACETAMINOPHEN 325 MG PO TABS: 650.0000 mg | ORAL_TABLET | Freq: Four times a day (QID) | ORAL | Status: DC | PRN

## 2020-08-24 MED ORDER — ACETAMINOPHEN 650 MG RE SUPP: 650.0000 mg | Freq: Four times a day (QID) | RECTAL | Status: DC | PRN

## 2020-08-24 MED ORDER — ACETAMINOPHEN 10 MG/ML IV SOLN
1000.0000 mg | Freq: Once | INTRAVENOUS | Status: AC
  Administered 2020-08-25: 1000 mg via INTRAVENOUS
  Filled 2020-08-24: qty 100

## 2020-08-24 MED ORDER — ENOXAPARIN SODIUM 40 MG/0.4ML ~~LOC~~ SOLN: 40.0000 mg | Freq: Every day | SUBCUTANEOUS | Status: DC

## 2020-08-24 MED ORDER — MORPHINE SULFATE (PF) 4 MG/ML IV SOLN
4.0000 mg | Freq: Once | INTRAVENOUS | Status: AC
  Administered 2020-08-24: 4 mg via INTRAVENOUS
  Filled 2020-08-24: qty 1

## 2020-08-24 MED ORDER — DIPHENHYDRAMINE HCL 50 MG/ML IJ SOLN: 12.5000 mg | Freq: Four times a day (QID) | INTRAMUSCULAR | Status: DC | PRN

## 2020-08-24 MED ORDER — DROPERIDOL 2.5 MG/ML IJ SOLN
1.2500 mg | Freq: Once | INTRAMUSCULAR | Status: AC
  Administered 2020-08-24: 1.25 mg via INTRAVENOUS
  Filled 2020-08-24: qty 2

## 2020-08-24 MED ORDER — PANTOPRAZOLE SODIUM 40 MG IV SOLR
40.0000 mg | Freq: Every day | INTRAVENOUS | Status: DC
  Administered 2020-08-25: 40 mg via INTRAVENOUS
  Filled 2020-08-24: qty 40

## 2020-08-24 MED ORDER — DIPHENHYDRAMINE HCL 50 MG/ML IJ SOLN
12.5000 mg | Freq: Once | INTRAMUSCULAR | Status: AC
  Administered 2020-08-24: 12.5 mg via INTRAVENOUS
  Filled 2020-08-24: qty 1

## 2020-08-24 MED ORDER — ONDANSETRON HCL 4 MG/2ML IJ SOLN: 4.0000 mg | Freq: Four times a day (QID) | INTRAMUSCULAR | Status: DC | PRN

## 2020-08-24 NOTE — ED Provider Notes (Signed)
[facility] EMERGENCY DEPARTMENT Provider Note   CSN: [id number] Arrival date & time: 08/24/20  1716     History Chief Complaint  Patient presents with  . Abdominal Pain    [name] is a 34 y.o. female.  Pt presents to the ED today with abdominal pain.  She said pain started around noon.  It was initially in her RUQ, but has moved to her RLQ.  She has not had a fever, but has had n/v.  She denies any back pain.  She has a hx of kidney stones, but this feels different.          Past Medical History:  Diagnosis Date  . Anxiety   . Depression   . Kidney infection   . Kidney stones   . PTSD (post-traumatic stress disorder)     Patient Active Problem List   Diagnosis Date Noted  . Acute appendicitis 08/24/2020  . Dizziness and giddiness 10/15/2015  . Headache 10/14/2015  . Syncope and collapse 09/26/2015  . Rapid palpitations 01/16/2014  . Smoker 12/26/2013  . Rh negative state in antepartum period 10/31/2013    Past Surgical History:  Procedure Laterality Date  . ANTERIOR CRUCIATE LIGAMENT REPAIR  2005  . CHOLECYSTECTOMY    . TONSILLECTOMY       OB History    Gravida  7   Para  3   Term  3   Preterm  0   AB  4   Living  3     SAB  4   TAB  0   Ectopic  0   Multiple  0   Live Births  3           Family History  Problem Relation Age of Onset  . Cancer Mother 1       breast  . Chiari malformation Brother   . Hypertension Maternal Grandmother   . Heart disease Maternal Grandfather   . Diabetes Maternal Grandfather   . Heart failure Maternal Grandfather   . Kidney disease Maternal Grandfather   . Hypertension Paternal Grandmother   . Hyperlipidemia Neg Hx   . Stroke Neg Hx     Social History   Tobacco Use  . Smoking status: Current Some Day Smoker    Packs/day: 0.25    Years: 10.00    Pack years: 2.50    Types: Cigarettes  . Smokeless tobacco: Never Used  Substance Use Topics  . Alcohol use: No     Alcohol/week: 0.0 standard drinks    Comment: rarely  . Drug use: No    Home Medications Prior to Admission medications   Medication Sig Start Date End Date Taking? Authorizing Provider  Multiple Vitamin (MULTIVITAMIN WITH MINERALS) TABS tablet Take 1 tablet by mouth daily.    [provider]    Allergies    Phenergan [promethazine] and Tramadol  Review of Systems   Review of Systems  Gastrointestinal: Positive for abdominal pain, nausea and vomiting.  All other systems reviewed and are negative.   Physical Exam Updated Vital Signs BP 126/75   Pulse 80   Temp 98.7 F (37.1 C) (Oral)   Resp 18   Ht 5\' 3"  (1.6 m)   Wt 90.7 kg   SpO2 97%   BMI 35.43 kg/m   Physical Exam Vitals and nursing note reviewed.  Constitutional:      Appearance: She is well-developed.  HENT:     Head: Normocephalic and atraumatic.  Mouth/Throat:     Mouth: Mucous membranes are moist.     Pharynx: Oropharynx is clear.  Eyes:     Extraocular Movements: Extraocular movements intact.     Pupils: Pupils are equal, round, and reactive to light.  Cardiovascular:     Rate and Rhythm: Normal rate and regular rhythm.     Heart sounds: Normal heart sounds.  Pulmonary:     Effort: Pulmonary effort is normal.     Breath sounds: Normal breath sounds.  Abdominal:     General: Abdomen is flat. Bowel sounds are normal.     Palpations: Abdomen is soft.     Tenderness: There is abdominal tenderness in the right lower quadrant.  Skin:    General: Skin is warm.     Capillary Refill: Capillary refill takes less than 2 seconds.  Neurological:     General: No focal deficit present.     Mental Status: She is alert and oriented to person, place, and time.  Psychiatric:        Mood and Affect: Mood normal.        Behavior: Behavior normal.     ED Results / Procedures / Treatments   Labs (all labs ordered are listed, but only abnormal results are displayed) Labs Reviewed  COMPREHENSIVE  METABOLIC PANEL - Abnormal; Notable for the following components:      Result Value   Glucose, Bld 108 (*)    All other components within normal limits  CBC - Abnormal; Notable for the following components:   WBC 26.3 (*)    All other components within normal limits  URINALYSIS, ROUTINE W REFLEX MICROSCOPIC - Abnormal; Notable for the following components:   APPearance HAZY (*)    Hgb urine dipstick SMALL (*)    Leukocytes,Ua SMALL (*)    Bacteria, UA RARE (*)    All other components within normal limits  RESP PANEL BY RT-PCR (FLU A&B, COVID) ARPGX2  LIPASE, BLOOD  HIV ANTIBODY (ROUTINE TESTING W REFLEX)  BASIC METABOLIC PANEL  CBC  I-STAT BETA HCG BLOOD, ED ([facility], [facility], [facility] ONLY)    EKG None  Radiology CT ABDOMEN PELVIS W CONTRAST  Addendum Date: 08/24/2020   ADDENDUM REPORT: 08/24/2020 21:50 ADDENDUM: Small 13 mm fluid attenuation cystic structure is seen in the expected vicinity of the urethral meatus slightly to the right of midline. Could reflect a small periurethral/Skene's duct cyst versus urethral diverticulum. Electronically Signed   By: Kreg Shropshire M.D.   On: 08/24/2020 21:50   Result Date: 08/24/2020 CLINICAL DATA:  Right abdominal pain which began in the right upper quadrant progressing inferiorly, stabbing pain, nausea and vomiting EXAM: CT ABDOMEN AND PELVIS WITH CONTRAST TECHNIQUE: Multidetector CT imaging of the abdomen and pelvis was performed using the standard protocol following bolus administration of intravenous contrast. CONTRAST:  OMNIPAQUE IOHEXOL 300 MG/ML  SOLN COMPARISON:  CT 02/09/2013 FINDINGS: Lower chest: Lung bases are clear. Normal heart size. No pericardial effusion. Hepatobiliary: No worrisome focal liver lesions. Smooth liver surface contour. Normal hepatic attenuation. Prior cholecystectomy. Prominence of the biliary tree likely related to post cholecystic reservoir effect without visible calcified gallstone. Pancreas: No pancreatic ductal  dilatation or surrounding inflammatory changes. Spleen: Normal in size. No concerning splenic lesions. Adrenals/Urinary Tract: Normal adrenal glands. Kidneys are normally located with symmetric enhancement. No suspicious renal lesion, urolithiasis or hydronephrosis. Urinary bladder is unremarkable. Stomach/Bowel: Distal esophagus, stomach and duodenal sweep are unremarkable. No small bowel wall thickening or dilatation. A borderline dilated,  hyperemic appearing fluid-filled appendix is seen extending medially from the cecal tip terminating near midline (3/68). Faint adjacent periappendiceal stranding is present, particularly towards the appendiceal base. No extraluminal gas, organized collection or abscess is seen. No colonic dilatation or wall thickening. No evidence of bowel obstruction. Vascular/Lymphatic: No significant vascular findings are present. No enlarged abdominal or pelvic lymph nodes. Reproductive: Anteverted uterus. Atypical rotation of the patient's intrauterine device within the endometrial canal with the lateral limbs extending towards the lower uterine segment and isthmus and the base of the IUD directed towards the right isthmus. No clear intramural perforation is seen however. No worrisome adnexal lesions. Other: No abdominopelvic free fluid or free gas. No bowel containing hernias. Musculoskeletal: No acute osseous abnormality or suspicious osseous lesion. IMPRESSION: 1. Dilated, hyperemic appendix extending medially from the cecal tip terminating near midline. Faint adjacent periappendiceal stranding is present, particularly towards the appendiceal base. Findings are compatible with an acute appendicitis in the appropriate clinical context. No evidence of perforation or abscess formation. 2. Atypical rotation of the patient's intrauterine device within the endometrial canal. No evidence of mural perforation. 3. Prior cholecystectomy. Prominence of the biliary tree likely related to post  cholecystic reservoir effect without visible calcified gallstone. Electronically Signed: By: Kreg Shropshire M.D. On: 08/24/2020 21:44    Procedures Procedures (including critical care time)  Medications Ordered in ED Medications  enoxaparin (LOVENOX) injection 40 mg (has no administration in time range)  dextrose 5 % and 0.45 % NaCl with KCl 20 mEq/L infusion (has no administration in time range)  piperacillin-tazobactam (ZOSYN) IVPB 3.375 g (has no administration in time range)  fentaNYL (SUBLIMAZE) injection 25-50 mcg (has no administration in time range)  diphenhydrAMINE (BENADRYL) 12.5 MG/5ML elixir 12.5 mg (has no administration in time range)    Or  diphenhydrAMINE (BENADRYL) injection 12.5 mg (has no administration in time range)  ondansetron (ZOFRAN-ODT) disintegrating tablet 4 mg (has no administration in time range)    Or  ondansetron (ZOFRAN) injection 4 mg (has no administration in time range)  pantoprazole (PROTONIX) injection 40 mg (has no administration in time range)  metoCLOPramide (REGLAN) injection 10 mg (has no administration in time range)  acetaminophen (OFIRMEV) IV 1,000 mg (has no administration in time range)  ketorolac (TORADOL) 30 MG/ML injection 30 mg (30 mg Intravenous Given 08/24/20 2314)  acetaminophen (TYLENOL) tablet 650 mg (has no administration in time range)    Or  acetaminophen (TYLENOL) suppository 650 mg (has no administration in time range)  sodium chloride 0.9 % bolus 1,000 mL (0 mLs Intravenous Stopped 08/24/20 2309)  ondansetron (ZOFRAN) injection 4 mg (4 mg Intravenous Given 08/24/20 2101)  morphine 4 MG/ML injection 4 mg (4 mg Intravenous Given 08/24/20 2101)  iohexol (OMNIPAQUE) 300 MG/ML solution 100 mL (100 mLs Intravenous Contrast Given 08/24/20 2110)  ondansetron (ZOFRAN) injection 4 mg (4 mg Intravenous Given 08/24/20 2154)  piperacillin-tazobactam (ZOSYN) IVPB 3.375 g (0 g Intravenous Stopped 08/24/20 2309)  droperidol (INAPSINE) 2.5 MG/ML  injection 1.25 mg (1.25 mg Intravenous Given 08/24/20 2309)  diphenhydrAMINE (BENADRYL) injection 12.5 mg (12.5 mg Intravenous Given 08/24/20 2309)    ED Course  I have reviewed the triage vital signs and the nursing notes.  Pertinent labs & imaging results that were available during my care of the patient were reviewed by me and considered in my medical decision making (see chart for details).    MDM Rules/Calculators/A&P  Results of CT scan d/w patient.  She is started on zosyn as the hospital is low on IV flagyl.  Pt d/w Dr. Andrey CampanileWilson (surgery) who will admit.  He plans on surgery tomorrow.  Covid neg.  Final Clinical Impression(s) / ED Diagnoses Final diagnoses:  Acute appendicitis with localized peritonitis, without perforation, abscess, or gangrene    Rx / DC Orders ED Discharge Orders    None       Jacalyn LefevreHaviland, Rubens Cranston, MD 08/24/20 2326

## 2020-08-24 NOTE — ED Triage Notes (Signed)
Pt reports R sided abd pain that initially started in RUQ around noon today and has gradually moved down.  Now reports stabbing pain to RLQ with nausea and vomited x 1.

## 2020-08-24 NOTE — H&P (Signed)
CC: abdominal pain  Requesting provider: dr Particia Nearing  HPI: [name] is an 34 y.o. female who is here for evaluation for acute onset of right-sided pain that started around noon today.  Patient states pain initially started in her right upper abdomen but progressively moved toward her right lower quadrant.  It has been persistent.  No prior similar symptoms.  Nausea.  She had one episode of vomiting earlier today and has had several episodes of emesis after receiving morphine in the emergency room.  She states that she is generally sensitive to pain medication.  Last bowel movement was this morning right before her pain started.  No fever but some subjective chills and sweats.  No dysuria or hematuria, melena hematochezia or vaginal discharge.  Prior abdominal surgery includes laparoscopic cholecystectomy in 2008  Does smoke-a pack will last several days occasional alcohol.  No drugs. Her medication is Prozac  Past Medical History:  Diagnosis Date  . Anxiety   . Depression   . Kidney infection   . Kidney stones   . PTSD (post-traumatic stress disorder)     Past Surgical History:  Procedure Laterality Date  . ANTERIOR CRUCIATE LIGAMENT REPAIR  2005  . CHOLECYSTECTOMY    . TONSILLECTOMY      Family History  Problem Relation Age of Onset  . Cancer Mother 8       breast  . Chiari malformation Brother   . Hypertension Maternal Grandmother   . Heart disease Maternal Grandfather   . Diabetes Maternal Grandfather   . Heart failure Maternal Grandfather   . Kidney disease Maternal Grandfather   . Hypertension Paternal Grandmother   . Hyperlipidemia Neg Hx   . Stroke Neg Hx     Social:  reports that she has been smoking cigarettes. She has a 2.50 pack-year smoking history. She has never used smokeless tobacco. She reports that she does not drink alcohol and does not use drugs.  Allergies:  Allergies  Allergen Reactions  . Phenergan [Promethazine] Rash and Other (See  Comments)    REACTION unknown (nerve difficulty)  . Tramadol Rash    Medications: I have reviewed the patient's current medications.   ROS - all of the below systems have been reviewed with the patient and positives are indicated with bold text General: chills, fever or night sweats Eyes: blurry vision or double vision ENT: epistaxis or sore throat Allergy/Immunology: itchy/watery eyes or nasal congestion Hematologic/Lymphatic: bleeding problems, blood clots or swollen lymph nodes Endocrine: temperature intolerance or unexpected weight changes Breast: new or changing breast lumps or nipple discharge Resp: cough, shortness of breath, or wheezing CV: chest pain or dyspnea on exertion GI: as per HPI GU: dysuria, trouble voiding, or hematuria MSK: joint pain or joint stiffness Neuro: TIA or stroke symptoms Derm: pruritus and skin lesion changes Psych: anxiety and depression  PE Blood pressure 126/75, pulse 80, temperature 98.7 F (37.1 C), temperature source Oral, resp. rate 18, height 5\' 3"  (1.6 m), weight 90.7 kg, SpO2 97 %. Constitutional: NAD; conversant; no deformities, sitting up in bed. Tearful at times; obesity Eyes: Moist conjunctiva; no lid lag; anicteric; PERRL Neck: Trachea midline; no thyromegaly Lungs: Normal respiratory effort; no tactile fremitus CV: RRR; no palpable thrills; no pitting edema GI: Abd soft, old trocar sites, TTP in RLQ, mild TTP periumbilical, no peritonitis; no palpable hepatosplenomegaly MSK: Normal gait; no clubbing/cyanosis Psychiatric: Appropriate affect; alert and oriented x3 Lymphatic: No palpable cervical or axillary lymphadenopathy Skin:no rash/jaundice/lesions  Results for orders  placed or performed during the hospital encounter of 08/24/20 (from the past 48 hour(s))  Lipase, blood     Status: None   Collection Time: 08/24/20  5:29 PM  Result Value Ref Range   Lipase 21 11 - 51 U/L    Comment: Performed at [facility], [address] [place], [place] [address]  Comprehensive metabolic panel     Status: Abnormal   Collection Time: 08/24/20  5:29 PM  Result Value Ref Range   Sodium 135 135 - 145 mmol/L   Potassium 4.0 3.5 - 5.1 mmol/L   Chloride 101 98 - 111 mmol/L   CO2 22 22 - 32 mmol/L   Glucose, Bld 108 (H) 70 - 99 mg/dL    Comment: Glucose reference range applies only to samples taken after fasting for at least 8 hours.   BUN 9 6 - 20 mg/dL   Creatinine, Ser 4.090.64 0.44 - 1.00 mg/dL   Calcium 9.6 8.9 - 81.110.3 mg/dL   Total Protein 6.8 6.5 - 8.1 g/dL   Albumin 3.7 3.5 - 5.0 g/dL   AST 18 15 - 41 U/L   ALT 21 0 - 44 U/L   Alkaline Phosphatase 94 38 - 126 U/L   Total Bilirubin 0.8 0.3 - 1.2 mg/dL   GFR, Estimated >91>60 >47>60 mL/min    Comment: (NOTE) Calculated using the CKD-EPI Creatinine Equation (2021)    Anion gap 12 5 - 15    Comment: Performed at [facility], [address] [place], [place] [address]  CBC     Status: Abnormal   Collection Time: 08/24/20  5:29 PM  Result Value Ref Range   WBC 26.3 (H) 4.0 - 10.5 K/uL   RBC 4.54 3.87 - 5.11 MIL/uL   Hemoglobin 14.0 12.0 - 15.0 g/dL   HCT 21.340.5 36 - 46 %   MCV 89.2 80.0 - 100.0 fL   MCH 30.8 26.0 - 34.0 pg   MCHC 34.6 30.0 - 36.0 g/dL   RDW 08.612.6 57.811.5 - 46.915.5 %   Platelets 273 150 - 400 K/uL   nRBC 0.0 0.0 - 0.2 %    Comment: Performed at [facility], [address] [place], [place] [address]  Urinalysis, Routine w reflex microscopic     Status: Abnormal   Collection Time: 08/24/20  5:43 PM  Result Value Ref Range   Color, Urine YELLOW YELLOW   APPearance HAZY (A) CLEAR   Specific Gravity, Urine 1.017 1.005 - 1.030   pH 5.0 5.0 - 8.0   Glucose, UA NEGATIVE NEGATIVE mg/dL   Hgb urine dipstick SMALL (A) NEGATIVE   Bilirubin Urine NEGATIVE NEGATIVE   Ketones, ur NEGATIVE NEGATIVE mg/dL   Protein, ur NEGATIVE NEGATIVE mg/dL   Nitrite NEGATIVE NEGATIVE   Leukocytes,Ua SMALL (A) NEGATIVE   RBC / HPF 0-5 0 - 5 RBC/hpf   WBC, UA 6-10 0  - 5 WBC/hpf   Bacteria, UA RARE (A) NONE SEEN   Squamous Epithelial / LPF 0-5 0 - 5   Mucus PRESENT     Comment: Performed at [facility], [address] [place], [place] [address]  I-Stat beta hCG blood, ED     Status: None   Collection Time: 08/24/20  5:49 PM  Result Value Ref Range   I-stat hCG, quantitative <5.0 <5 mIU/mL   Comment 3            Comment:   GEST. AGE  CONC.  (mIU/mL)   <=1 WEEK        5 - 50     2 WEEKS       50 - 500     3 WEEKS       100 - 10,000     4 WEEKS     1,000 - 30,000        FEMALE AND NON-PREGNANT FEMALE:     LESS THAN 5 mIU/mL     CT ABDOMEN PELVIS W CONTRAST  Addendum Date: 08/24/2020   ADDENDUM REPORT: 08/24/2020 21:50 ADDENDUM: Small 13 mm fluid attenuation cystic structure is seen in the expected vicinity of the urethral meatus slightly to the right of midline. Could reflect a small periurethral/Skene's duct cyst versus urethral diverticulum. Electronically Signed   By: Kreg Shropshire M.D.   On: 08/24/2020 21:50   Result Date: 08/24/2020 CLINICAL DATA:  Right abdominal pain which began in the right upper quadrant progressing inferiorly, stabbing pain, nausea and vomiting EXAM: CT ABDOMEN AND PELVIS WITH CONTRAST TECHNIQUE: Multidetector CT imaging of the abdomen and pelvis was performed using the standard protocol following bolus administration of intravenous contrast. CONTRAST:  OMNIPAQUE IOHEXOL 300 MG/ML  SOLN COMPARISON:  CT 02/09/2013 FINDINGS: Lower chest: Lung bases are clear. Normal heart size. No pericardial effusion. Hepatobiliary: No worrisome focal liver lesions. Smooth liver surface contour. Normal hepatic attenuation. Prior cholecystectomy. Prominence of the biliary tree likely related to post cholecystic reservoir effect without visible calcified gallstone. Pancreas: No pancreatic ductal dilatation or surrounding inflammatory changes. Spleen: Normal in size. No concerning splenic lesions. Adrenals/Urinary Tract: Normal adrenal  glands. Kidneys are normally located with symmetric enhancement. No suspicious renal lesion, urolithiasis or hydronephrosis. Urinary bladder is unremarkable. Stomach/Bowel: Distal esophagus, stomach and duodenal sweep are unremarkable. No small bowel wall thickening or dilatation. A borderline dilated, hyperemic appearing fluid-filled appendix is seen extending medially from the cecal tip terminating near midline (3/68). Faint adjacent periappendiceal stranding is present, particularly towards the appendiceal base. No extraluminal gas, organized collection or abscess is seen. No colonic dilatation or wall thickening. No evidence of bowel obstruction. Vascular/Lymphatic: No significant vascular findings are present. No enlarged abdominal or pelvic lymph nodes. Reproductive: Anteverted uterus. Atypical rotation of the patient's intrauterine device within the endometrial canal with the lateral limbs extending towards the lower uterine segment and isthmus and the base of the IUD directed towards the right isthmus. No clear intramural perforation is seen however. No worrisome adnexal lesions. Other: No abdominopelvic free fluid or free gas. No bowel containing hernias. Musculoskeletal: No acute osseous abnormality or suspicious osseous lesion. IMPRESSION: 1. Dilated, hyperemic appendix extending medially from the cecal tip terminating near midline. Faint adjacent periappendiceal stranding is present, particularly towards the appendiceal base. Findings are compatible with an acute appendicitis in the appropriate clinical context. No evidence of perforation or abscess formation. 2. Atypical rotation of the patient's intrauterine device within the endometrial canal. No evidence of mural perforation. 3. Prior cholecystectomy. Prominence of the biliary tree likely related to post cholecystic reservoir effect without visible calcified gallstone. Electronically Signed: By: Kreg Shropshire M.D. On: 08/24/2020 21:44     Imaging: Personally reviewed   A/P: [name] is an 34 y.o. female with  Acute onset RLQ, leukocytosis, dilated appendix c/w acute appendicitis Obesity Tobacco use  covid panel pending  No fever. No tachycardia. No peritonitis. Appendix mildly dilated on ct. Symptoms started less than 12 hrs ago so I think we can admit to outpt, cont  IV abx, and plan appendectomy with Dr Corliss Skains tomorrow am.    We discussed the etiology and management of acute appendicitis. We discussed operative and nonoperative management.  I recommended operative management along with IV antibiotics.  We discussed laparoscopic appendectomy. We discussed the risk and benefits of surgery including but not limited to bleeding, infection, injury to surrounding structures, need to convert to an open procedure, blood clot formation, post operative abscess or wound infection, staple line complications such as leak or bleeding, hernia formation, post operative ileus, need for additional procedures, anesthesia complications, and the typical postoperative course. I explained that the patient should expect a good improvement in their symptoms.   Mary Sella. Andrey Campanile, MD, FACS General, Bariatric, & Minimally Invasive Surgery [facility], [place]

## 2020-08-25 ENCOUNTER — Encounter (HOSPITAL_COMMUNITY): Payer: Self-pay

## 2020-08-25 ENCOUNTER — Encounter (HOSPITAL_COMMUNITY)
Admission: EM | Disposition: A | Discharge status: Home / Self Care | Payer: Self-pay | Source: Home / Self Care | Attending: Emergency Medicine

## 2020-08-25 ENCOUNTER — Observation Stay (HOSPITAL_COMMUNITY): Payer: 59 | Admitting: Anesthesiology

## 2020-08-25 HISTORY — PX: LAPAROSCOPIC APPENDECTOMY: SHX408

## 2020-08-25 LAB — CBC
HCT: 35.3 % — ABNORMAL LOW (ref 36.0–46.0)
Hemoglobin: 12.2 g/dL (ref 12.0–15.0)
MCH: 30.6 pg (ref 26.0–34.0)
MCHC: 34.6 g/dL (ref 30.0–36.0)
MCV: 88.5 fL (ref 80.0–100.0)
Platelets: 250 10*3/uL (ref 150–400)
RBC: 3.99 MIL/uL (ref 3.87–5.11)
RDW: 12.7 % (ref 11.5–15.5)
WBC: 18.3 10*3/uL — ABNORMAL HIGH (ref 4.0–10.5)
nRBC: 0 % (ref 0.0–0.2)

## 2020-08-25 LAB — BASIC METABOLIC PANEL
Anion gap: 10 (ref 5–15)
BUN: 8 mg/dL (ref 6–20)
CO2: 23 mmol/L (ref 22–32)
Calcium: 8.7 mg/dL — ABNORMAL LOW (ref 8.9–10.3)
Chloride: 104 mmol/L (ref 98–111)
Creatinine, Ser: 0.83 mg/dL (ref 0.44–1.00)
GFR, Estimated: >60 mL/min (ref >60)
Glucose, Bld: 129 mg/dL — ABNORMAL HIGH (ref 70–99)
Potassium: 3.9 mmol/L (ref 3.5–5.1)
Sodium: 137 mmol/L (ref 135–145)

## 2020-08-25 LAB — SURGICAL PCR SCREEN
MRSA, PCR: NEGATIVE
Staphylococcus aureus: NEGATIVE

## 2020-08-25 LAB — HIV ANTIBODY (ROUTINE TESTING W REFLEX): HIV Screen 4th Generation wRfx: NONREACTIVE

## 2020-08-25 SURGERY — APPENDECTOMY, LAPAROSCOPIC
Anesthesia: General | Site: Abdomen

## 2020-08-25 MED ORDER — CHLORHEXIDINE GLUCONATE 0.12 % MT SOLN: 15.0000 mL | Freq: Once | OROMUCOSAL | Status: AC

## 2020-08-25 MED ORDER — ONDANSETRON HCL 4 MG/2ML IJ SOLN
INTRAMUSCULAR | Status: AC
  Filled 2020-08-25: qty 2

## 2020-08-25 MED ORDER — ONDANSETRON 4 MG PO TBDP: 4.0000 mg | ORAL_TABLET | Freq: Four times a day (QID) | ORAL | 0 refills | Status: AC | PRN

## 2020-08-25 MED ORDER — DEXAMETHASONE SODIUM PHOSPHATE 10 MG/ML IJ SOLN
INTRAMUSCULAR | Status: AC
  Filled 2020-08-25: qty 1

## 2020-08-25 MED ORDER — FENTANYL CITRATE (PF) 100 MCG/2ML IJ SOLN
25.0000 ug | INTRAMUSCULAR | Status: DC | PRN
  Administered 2020-08-25: 25 ug via INTRAVENOUS

## 2020-08-25 MED ORDER — PROPOFOL 10 MG/ML IV BOLUS
INTRAVENOUS | Status: AC
  Filled 2020-08-25: qty 20

## 2020-08-25 MED ORDER — KETOROLAC TROMETHAMINE 30 MG/ML IJ SOLN
INTRAMUSCULAR | Status: AC
  Filled 2020-08-25: qty 1

## 2020-08-25 MED ORDER — OXYCODONE HCL 5 MG PO TABS: 5.0000 mg | ORAL_TABLET | Freq: Once | ORAL | Status: DC | PRN

## 2020-08-25 MED ORDER — IBUPROFEN 800 MG PO TABS: 800.0000 mg | ORAL_TABLET | Freq: Three times a day (TID) | ORAL | 0 refills | Status: AC | PRN

## 2020-08-25 MED ORDER — ALBUTEROL SULFATE HFA 108 (90 BASE) MCG/ACT IN AERS
INHALATION_SPRAY | RESPIRATORY_TRACT | Status: DC | PRN
  Administered 2020-08-25: 5 via RESPIRATORY_TRACT

## 2020-08-25 MED ORDER — ACETAMINOPHEN 500 MG PO TABS: 1000.0000 mg | ORAL_TABLET | Freq: Three times a day (TID) | ORAL | 0 refills | Status: AC | PRN

## 2020-08-25 MED ORDER — BUPIVACAINE-EPINEPHRINE (PF) 0.25% -1:200000 IJ SOLN
INTRAMUSCULAR | Status: AC
  Filled 2020-08-25: qty 30

## 2020-08-25 MED ORDER — PROMETHAZINE HCL 25 MG/ML IJ SOLN: 6.2500 mg | INTRAMUSCULAR | Status: DC | PRN

## 2020-08-25 MED ORDER — METHOCARBAMOL 750 MG PO TABS: 750.0000 mg | ORAL_TABLET | Freq: Four times a day (QID) | ORAL | 0 refills | Status: AC | PRN

## 2020-08-25 MED ORDER — FENTANYL CITRATE (PF) 250 MCG/5ML IJ SOLN
INTRAMUSCULAR | Status: DC | PRN
  Administered 2020-08-25: 150 ug via INTRAVENOUS

## 2020-08-25 MED ORDER — MIDAZOLAM HCL 2 MG/2ML IJ SOLN
INTRAMUSCULAR | Status: AC
  Filled 2020-08-25: qty 2

## 2020-08-25 MED ORDER — BUPIVACAINE-EPINEPHRINE 0.25% -1:200000 IJ SOLN
INTRAMUSCULAR | Status: DC | PRN
  Administered 2020-08-25: 10 mL

## 2020-08-25 MED ORDER — ACETAMINOPHEN 10 MG/ML IV SOLN: 1000.0000 mg | Freq: Once | INTRAVENOUS | Status: DC | PRN

## 2020-08-25 MED ORDER — 0.9 % SODIUM CHLORIDE (POUR BTL) OPTIME
TOPICAL | Status: DC | PRN
  Administered 2020-08-25: 1000 mL

## 2020-08-25 MED ORDER — LIDOCAINE 2% (20 MG/ML) 5 ML SYRINGE
INTRAMUSCULAR | Status: DC | PRN
  Administered 2020-08-25: 60 mg via INTRAVENOUS

## 2020-08-25 MED ORDER — OXYCODONE HCL 5 MG PO TABS: 5.0000 mg | ORAL_TABLET | ORAL | Status: DC | PRN

## 2020-08-25 MED ORDER — LIDOCAINE HCL (PF) 2 % IJ SOLN
INTRAMUSCULAR | Status: AC
  Filled 2020-08-25: qty 5

## 2020-08-25 MED ORDER — OXYCODONE HCL 5 MG/5ML PO SOLN: 5.0000 mg | Freq: Once | ORAL | Status: DC | PRN

## 2020-08-25 MED ORDER — SUGAMMADEX SODIUM 200 MG/2ML IV SOLN
INTRAVENOUS | Status: DC | PRN
  Administered 2020-08-25: 200 mg via INTRAVENOUS

## 2020-08-25 MED ORDER — ACETAMINOPHEN 500 MG PO TABS
1000.0000 mg | ORAL_TABLET | Freq: Four times a day (QID) | ORAL | Status: DC
  Administered 2020-08-25: 1000 mg via ORAL
  Filled 2020-08-25: qty 2

## 2020-08-25 MED ORDER — METHOCARBAMOL 750 MG PO TABS: 750.0000 mg | ORAL_TABLET | Freq: Four times a day (QID) | ORAL | Status: DC | PRN

## 2020-08-25 MED ORDER — DEXAMETHASONE SODIUM PHOSPHATE 10 MG/ML IJ SOLN
INTRAMUSCULAR | Status: DC | PRN
  Administered 2020-08-25: 5 mg via INTRAVENOUS

## 2020-08-25 MED ORDER — BUPIVACAINE-EPINEPHRINE (PF) 0.25% -1:200000 IJ SOLN
INTRAMUSCULAR | Status: AC
  Filled 2020-08-25: qty 10

## 2020-08-25 MED ORDER — MIDAZOLAM HCL 5 MG/5ML IJ SOLN
INTRAMUSCULAR | Status: DC | PRN
  Administered 2020-08-25: 2 mg via INTRAVENOUS

## 2020-08-25 MED ORDER — PROPOFOL 10 MG/ML IV BOLUS
INTRAVENOUS | Status: DC | PRN
  Administered 2020-08-25: 200 mg via INTRAVENOUS

## 2020-08-25 MED ORDER — ROCURONIUM BROMIDE 10 MG/ML (PF) SYRINGE
PREFILLED_SYRINGE | INTRAVENOUS | Status: DC | PRN
  Administered 2020-08-25: 30 mg via INTRAVENOUS
  Administered 2020-08-25: 20 mg via INTRAVENOUS

## 2020-08-25 MED ORDER — SODIUM CHLORIDE 0.9 % IR SOLN: Status: DC | PRN

## 2020-08-25 MED ORDER — OXYCODONE HCL 5 MG PO TABS: 5.0000 mg | ORAL_TABLET | Freq: Four times a day (QID) | ORAL | 0 refills | Status: AC | PRN

## 2020-08-25 MED ORDER — LACTATED RINGERS IV SOLN: INTRAVENOUS | Status: DC

## 2020-08-25 MED ORDER — FENTANYL CITRATE (PF) 100 MCG/2ML IJ SOLN
INTRAMUSCULAR | Status: AC
  Filled 2020-08-25: qty 2

## 2020-08-25 MED ORDER — ONDANSETRON HCL 4 MG/2ML IJ SOLN
INTRAMUSCULAR | Status: DC | PRN
  Administered 2020-08-25: 4 mg via INTRAVENOUS

## 2020-08-25 MED ORDER — CHLORHEXIDINE GLUCONATE 0.12 % MT SOLN
OROMUCOSAL | Status: AC
  Administered 2020-08-25: 15 mL via OROMUCOSAL
  Filled 2020-08-25: qty 15

## 2020-08-25 MED ORDER — FENTANYL CITRATE (PF) 250 MCG/5ML IJ SOLN
INTRAMUSCULAR | Status: AC
  Filled 2020-08-25: qty 5

## 2020-08-25 MED ORDER — KETOROLAC TROMETHAMINE 30 MG/ML IJ SOLN
30.0000 mg | Freq: Once | INTRAMUSCULAR | Status: AC
  Administered 2020-08-25: 30 mg via INTRAVENOUS

## 2020-08-25 SURGICAL SUPPLY — 48 items
APL PRP STRL LF DISP 70% ISPRP (MISCELLANEOUS) ×1
APL SKNCLS STERI-STRIP NONHPOA (GAUZE/BANDAGES/DRESSINGS) ×1
APPLIER CLIP ROT 10 11.4 M/L (STAPLE)
APR CLP MED LRG 11.4X10 (STAPLE)
BAG SPEC RTRVL LRG 6X4 10 (ENDOMECHANICALS) ×1
BENZOIN TINCTURE PRP APPL 2/3 (GAUZE/BANDAGES/DRESSINGS) ×3 IMPLANT
BLADE CLIPPER SURG (BLADE) IMPLANT
CANISTER SUCT 3000ML PPV (MISCELLANEOUS) ×2 IMPLANT
CHLORAPREP W/TINT 26 (MISCELLANEOUS) ×3 IMPLANT
CLIP APPLIE ROT 10 11.4 M/L (STAPLE) IMPLANT
CLOSURE STERI-STRIP 1/2X4 (GAUZE/BANDAGES/DRESSINGS) ×1
CLSR STERI-STRIP ANTIMIC 1/2X4 (GAUZE/BANDAGES/DRESSINGS) ×1 IMPLANT
COVER SURGICAL LIGHT HANDLE (MISCELLANEOUS) ×3 IMPLANT
COVER WAND RF STERILE (DRAPES) ×3 IMPLANT
CUTTER FLEX LINEAR 45M (STAPLE) ×3 IMPLANT
DRSG TEGADERM 2-3/8X2-3/4 SM (GAUZE/BANDAGES/DRESSINGS) ×6 IMPLANT
DRSG TEGADERM 4X4.75 (GAUZE/BANDAGES/DRESSINGS) ×3 IMPLANT
ELECT REM PT RETURN 9FT ADLT (ELECTROSURGICAL) ×3
ELECTRODE REM PT RTRN 9FT ADLT (ELECTROSURGICAL) ×1 IMPLANT
ENDOLOOP SUT PDS II  0 18 (SUTURE)
ENDOLOOP SUT PDS II 0 18 (SUTURE) IMPLANT
GLOVE BIO SURGEON STRL SZ7 (GLOVE) ×3 IMPLANT
GLOVE BIOGEL PI IND STRL 7.5 (GLOVE) ×1 IMPLANT
GLOVE BIOGEL PI INDICATOR 7.5 (GLOVE) ×2
GOWN STRL REUS W/ TWL LRG LVL3 (GOWN DISPOSABLE) ×3 IMPLANT
GOWN STRL REUS W/TWL LRG LVL3 (GOWN DISPOSABLE) ×9
KIT BASIN OR (CUSTOM PROCEDURE TRAY) ×3 IMPLANT
KIT TURNOVER KIT B (KITS) ×3 IMPLANT
NS IRRIG 1000ML POUR BTL (IV SOLUTION) ×3 IMPLANT
PAD ARMBOARD 7.5X6 YLW CONV (MISCELLANEOUS) ×6 IMPLANT
POUCH SPECIMEN RETRIEVAL 10MM (ENDOMECHANICALS) ×3 IMPLANT
RELOAD STAPLE 45 3.5 BLU ETS (ENDOMECHANICALS) ×1 IMPLANT
RELOAD STAPLE TA45 3.5 REG BLU (ENDOMECHANICALS) ×3 IMPLANT
SCISSORS LAP 5X35 DISP (ENDOMECHANICALS) IMPLANT
SET IRRIG TUBING LAPAROSCOPIC (IRRIGATION / IRRIGATOR) IMPLANT
SET TUBE SMOKE EVAC HIGH FLOW (TUBING) ×3 IMPLANT
SHEARS HARMONIC ACE PLUS 36CM (ENDOMECHANICALS) ×3 IMPLANT
SLEEVE ENDOPATH XCEL 5M (ENDOMECHANICALS) ×3 IMPLANT
SPECIMEN JAR SMALL (MISCELLANEOUS) ×3 IMPLANT
SPONGE GAUZE 2X2 8PLY STER LF (GAUZE/BANDAGES/DRESSINGS) ×1
SPONGE GAUZE 2X2 8PLY STRL LF (GAUZE/BANDAGES/DRESSINGS) ×1 IMPLANT
SUT MNCRL AB 4-0 PS2 18 (SUTURE) ×3 IMPLANT
TOWEL GREEN STERILE (TOWEL DISPOSABLE) ×3 IMPLANT
TOWEL GREEN STERILE FF (TOWEL DISPOSABLE) ×3 IMPLANT
TRAY LAPAROSCOPIC MC (CUSTOM PROCEDURE TRAY) ×3 IMPLANT
TROCAR XCEL BLUNT TIP 100MML (ENDOMECHANICALS) ×3 IMPLANT
TROCAR XCEL NON-BLD 5MMX100MML (ENDOMECHANICALS) ×3 IMPLANT
WATER STERILE IRR 1000ML POUR (IV SOLUTION) ×3 IMPLANT

## 2020-08-25 NOTE — Anesthesia Postprocedure Evaluation (Signed)
Anesthesia Post Note  Patient: [name]  Procedure(s) Performed: APPENDECTOMY LAPAROSCOPIC (N/A Abdomen)     Patient location during evaluation: PACU Anesthesia Type: General Level of consciousness: awake and alert Pain management: pain level controlled Vital Signs Assessment: post-procedure vital signs reviewed and stable Respiratory status: spontaneous breathing, nonlabored ventilation, respiratory function stable and patient connected to nasal cannula oxygen Cardiovascular status: blood pressure returned to baseline and stable Postop Assessment: no apparent nausea or vomiting Anesthetic complications: no   No complications documented.  Last Vitals:  Vitals:   08/25/20 1210 08/25/20 1300  BP: (!) 94/54   Pulse: 72 80  Resp: 17 17  Temp: 36.7 C   SpO2: 94% 92%    Last Pain:  Vitals:   08/25/20 1210  TempSrc:   PainSc: 3                  Sammie Schermerhorn P Savalas Monje

## 2020-08-25 NOTE — Transfer of Care (Signed)
Immediate Anesthesia Transfer of Care Note  Patient: DEVANEE POMPLUN  Procedure(s) Performed: APPENDECTOMY LAPAROSCOPIC (N/A Abdomen)  Patient Location: PACU  Anesthesia Type:General  Level of Consciousness: drowsy  Airway & Oxygen Therapy: Patient Spontanous Breathing and Patient connected to face mask oxygen  Post-op Assessment: Report given to RN and Post -op Vital signs reviewed and stable  Post vital signs: Reviewed and stable  Last Vitals:  Vitals Value Taken Time  BP 109/56 08/25/20 1109  Temp    Pulse 78 08/25/20 1112  Resp 22 08/25/20 1112  SpO2 90 % 08/25/20 1112  Vitals shown include unvalidated device data.  Last Pain:  Vitals:   08/25/20 0525  TempSrc: Oral  PainSc:          Complications: No complications documented.

## 2020-08-25 NOTE — Discharge Summary (Signed)
Patient ID: [name] [id number] [birth year] 34 y.o.  Admit date: 08/24/2020 Discharge date: 08/25/2020  Admitting Diagnosis: Acute Appendicitis  Discharge Diagnosis Acute Appendicitis  Consultants None  H&P: [name] is an 34 y.o. female who is here for evaluation for acute onset of right-sided pain that started around noon today.  Patient states pain initially started in her right upper abdomen but progressively moved toward her right lower quadrant.  It has been persistent.  No prior similar symptoms.  Nausea.  She had one episode of vomiting earlier today and has had several episodes of emesis after receiving morphine in the emergency room.  She states that she is generally sensitive to pain medication.  Last bowel movement was this morning right before her pain started.  No fever but some subjective chills and sweats.  No dysuria or hematuria, melena hematochezia or vaginal discharge.  Prior abdominal surgery includes laparoscopic cholecystectomy in 2008  Does smoke-a pack will last several days occasional alcohol.  No drugs. Her medication is Prozac  Procedures Dr. Corliss Skains - Laparoscopic Appendectomy - 08/25/2020  Hospital Course:  The patient was admitted and underwent a laparoscopic appendectomy.  The patient tolerated the procedure well.  On POD 0, the patient was tolerating a regular diet, voiding well, mobilizing, and pain was controlled with oral pain medications. The patient was stable for DC home at this time with appropriate follow up made. A note was provided for work.   Physical Exam: Gen:  Alert, NAD, pleasant HEENT: EOM's intact, pupils equal and round Card:  RRR Pulm:  CTAB, no W/R/R, effort normal Abd: Soft, ND, appropriately tender around laparoscopic incisions, +BS, Incisions with gauze and tegaderm dressings in place that appear well and are without drainage or  bleeding Psych: A&Ox3  Skin: no rashes noted, warm and dry  Allergies as of 08/25/2020       Reactions   Phenergan [promethazine] Rash, Other (See Comments)   REACTION unknown (nerve difficulty)   Tramadol Rash      Medication List    STOP taking these medications   naproxen sodium 220 MG tablet Commonly known as: ALEVE     TAKE these medications   acetaminophen 500 MG tablet Commonly known as: TYLENOL Take 2 tablets (1,000 mg total) by mouth every 8 (eight) hours as needed.   FLUoxetine 40 MG capsule Commonly known as: PROZAC Take 40 mg by mouth daily. Take with 10mg  capsule to make 50mg    FLUoxetine 10 MG capsule Commonly known as: PROZAC Take 10 mg by mouth daily. Take with 40mg  capsule to make 50mg    ibuprofen 800 MG tablet Commonly known as: ADVIL Take 1 tablet (800 mg total) by mouth every 8 (eight) hours as needed. What changed:   medication strength  how much to take  when to take this  reasons to take this   methocarbamol 750 MG tablet Commonly known as: ROBAXIN Take 1 tablet (750 mg total) by mouth every 6 (six) hours as needed for muscle spasms.   multivitamin with minerals Tabs tablet Take 1 tablet by mouth daily.   ondansetron 4 MG disintegrating tablet Commonly known as: ZOFRAN-ODT Take 1 tablet (4 mg total) by mouth every 6 (six) hours as needed for nausea.   oxyCODONE 5 MG immediate release tablet Commonly known as: Oxy IR/ROXICODONE Take 1 tablet (5 mg total) by mouth every 6 (six) hours as needed for breakthrough pain.         Follow-up Information  Surgery, [facility]. Go on 09/16/2020.   Specialty: General Surgery Why: 8:45am. Please arrive 30 minutes prior to your appointment for paperwork. Please bring a copy of your photo ID and insurance card to the appointment.  Contact information: [address] [place] [address] [phone]               Signed: Leary Roca, [facility] 08/25/2020, 3:20 PM Please see Amion for pager number during day hours 7:00am-4:30pm

## 2020-08-25 NOTE — Op Note (Signed)
Appendectomy, Lap, Procedure Note  Indications: The patient presented with a history of right-sided abdominal pain. A CT scan revealed findings consistent with acute appendicitis.  Pre-operative Diagnosis: Acute appendicitis without mention of peritonitis  Post-operative Diagnosis: Same  Surgeon: Wynona Luna   Assistants: None  Anesthesia: General endotracheal anesthesia  ASA Class: 1E  Procedure Details  The patient was seen again in the Holding Room. The risks, benefits, complications, treatment options, and expected outcomes were discussed with the patient and/or family. The possibilities of reaction to medication, perforation of viscus, bleeding, recurrent infection, finding a normal appendix, the need for additional procedures, failure to diagnose a condition, and creating a complication requiring transfusion or operation were discussed. There was concurrence with the proposed plan and informed consent was obtained. The site of surgery was properly noted. The patient was taken to Operating Room, identified as [place] and the procedure verified as Appendectomy. A Time Out was held and the above information confirmed.  The patient was placed in the supine position and general anesthesia was induced.  The abdomen was prepped and draped in a sterile fashion. A one centimeter infraumbilical incision was made.  Dissection was carried down to the fascia bluntly.  The fascia was incised vertically.  We entered the peritoneal cavity bluntly.  A pursestring suture was passed around the incision with a 0 Vicryl.  The Hasson cannula was introduced into the abdomen and the tails of the suture were used to hold the Hasson in place.   The pneumoperitoneum was then established maintaining a maximum pressure of 15 mmHg.  Additional 5 mm cannulas then placed in the left lower quadrant of the abdomen and the right upper quadrant under direct visualization. A careful evaluation of the entire abdomen  was carried out. The patient was placed in Trendelenburg and left lateral decubitus position.  The scope was moved to the right upper quadrant port site. The cecum was mobilized medially.  The appendix was minimally inflamed and there was no sign of perforation or necrosis.  The appendix was carefully dissected. The appendix was skeletonized with the harmonic scalpel.   The appendix was divided at its base using an endo-GIA stapler. Minimal appendiceal stump was left in place. There was no evidence of bleeding, leakage, or complication after division of the appendix. Irrigation was also performed and irrigate suctioned from the abdomen as well.  The umbilical port site was closed with the purse string suture. There was no residual palpable fascial defect.  The trocar site skin wounds were closed with 4-0 Monocryl.  Instrument, sponge, and needle counts were correct at the conclusion of the case.   Findings: The appendix was found to be inflamed. There were not signs of necrosis.  There was not perforation. There was not abscess formation.  Estimated Blood Loss:  Minimal         Drains: none         Specimens: appendix         Complications:  None; patient tolerated the procedure well.         Disposition: PACU - hemodynamically stable.         Condition: stable  Wilmon Arms. Corliss Skains, MD, [facility]  General/ Trauma Surgery   08/25/2020 10:57 AM

## 2020-08-25 NOTE — Anesthesia Preprocedure Evaluation (Addendum)
Anesthesia Evaluation  Patient identified by MRN, date of birth, ID band Patient awake    Reviewed: Allergy & Precautions, NPO status , Patient's Chart, lab work & pertinent test results  Airway Mallampati: III  TM Distance: >3 FB Neck ROM: Full    Dental  (+) Poor Dentition, Chipped, Missing, Partial Upper   Pulmonary Current Smoker and Patient abstained from smoking.,    Pulmonary exam normal breath sounds clear to auscultation       Cardiovascular negative cardio ROS Normal cardiovascular exam Rhythm:Regular Rate:Normal     Neuro/Psych  Headaches, PSYCHIATRIC DISORDERS Anxiety Depression PTSD (post-traumatic stress disorder)   GI/Hepatic negative GI ROS, Neg liver ROS,   Endo/Other  negative endocrine ROS  Renal/GU negative Renal ROS     Musculoskeletal negative musculoskeletal ROS (+)   Abdominal (+) + obese,   Peds  Hematology negative hematology ROS (+)   Anesthesia Other Findings Appendicitis  Reproductive/Obstetrics hcg negative                            Anesthesia Physical Anesthesia Plan  ASA: II  Anesthesia Plan: General   Post-op Pain Management:    Induction: Intravenous  PONV Risk Score and Plan: 3 and Ondansetron, Dexamethasone, Midazolam and Treatment may vary due to age or medical condition  Airway Management Planned: Oral ETT  Additional Equipment:   Intra-op Plan:   Post-operative Plan: Extubation in OR  Informed Consent: I have reviewed the patients History and Physical, chart, labs and discussed the procedure including the risks, benefits and alternatives for the proposed anesthesia with the patient or authorized representative who has indicated his/her understanding and acceptance.     Dental advisory given  Plan Discussed with: CRNA  Anesthesia Plan Comments:        Anesthesia Quick Evaluation

## 2020-08-25 NOTE — Progress Notes (Signed)
Pt arrived to floor on stretcher and moved across to bed on their own, alert and oriented x4, CHG bath completed, MRSA and Staph swab completed, no sacral foam placed due to none available on the floor, bed in lowest position and call bell within reach

## 2020-08-25 NOTE — Discharge Instructions (Signed)
[facility]  Please arrive at least 30 min before your appointment to complete your check in paperwork.  If you are unable to arrive 30 min prior to your appointment time we may have to cancel or reschedule you. LAPAROSCOPIC SURGERY: POST OP INSTRUCTIONS Always review your discharge instruction sheet given to you by the facility where your surgery was performed. IF YOU HAVE DISABILITY OR FAMILY LEAVE FORMS, YOU MUST BRING THEM TO THE OFFICE FOR PROCESSING.   DO NOT GIVE THEM TO YOUR DOCTOR.  PAIN CONTROL  1. First take acetaminophen (Tylenol) AND/or ibuprofen (Advil) to control your pain after surgery.  Follow directions on package.  Taking acetaminophen (Tylenol) and/or ibuprofen (Advil) regularly after surgery will help to control your pain and lower the amount of prescription pain medication you may need.  You should not take more than 4,000 mg (4 grams) of acetaminophen (Tylenol) in 24 hours.  You should not take ibuprofen (Advil), aleve, motrin, naprosyn or other NSAIDS if you have a history of stomach ulcers or chronic kidney disease.  2. A prescription for pain medication may be given to you upon discharge.  Take your pain medication as prescribed, if you still have uncontrolled pain after taking acetaminophen (Tylenol) or ibuprofen (Advil). 3. Use ice packs to help control pain. 4. If you need a refill on your pain medication, please contact your pharmacy.  They will contact our office to request authorization. Prescriptions will not be filled after 5pm or on week-ends.  HOME MEDICATIONS 5. Take your usually prescribed medications unless otherwise directed.  DIET 6. You should follow a light diet the first few days after arrival home.  Be sure to include lots of fluids daily. Avoid fatty, fried foods.   CONSTIPATION 7. It is common to experience some constipation after surgery and if you are taking pain medication.  Increasing fluid intake and taking a stool  softener (such as Colace) will usually help or prevent this problem from occurring.  A mild laxative (Milk of Magnesia or Miralax) should be taken according to package instructions if there are no bowel movements after 48 hours.  WOUND/INCISION CARE 8. Most patients will experience some swelling and bruising in the area of the incisions.  Ice packs will help.  Swelling and bruising can take several days to resolve.  9. Unless discharge instructions indicate otherwise, follow guidelines below  a. STERI-STRIPS - you may remove your outer bandages 48 hours after surgery, and you may shower at that time.  You have steri-strips (small skin tapes) in place directly over the incision.  These strips should be left on the skin for 7-10 days.   b. DERMABOND/SKIN GLUE - you may shower in 24 hours.  The glue will flake off over the next 2-3 weeks. 10. Any sutures or staples will be removed at the office during your follow-up visit.  ACTIVITIES 11. You may resume regular (light) daily activities beginning the next day--such as daily self-care, walking, climbing stairs--gradually increasing activities as tolerated.  You may have sexual intercourse when it is comfortable.  Refrain from any heavy lifting or straining until approved by your doctor. a. You may drive when you are no longer taking prescription pain medication, you can comfortably wear a seatbelt, and you can safely maneuver your car and apply brakes.  FOLLOW-UP 12. You should see your doctor in the office for a follow-up appointment approximately 2-3 weeks after your surgery.  You should have been given your post-op/follow-up appointment when   your surgery was scheduled.  If you did not receive a post-op/follow-up appointment, make sure that you call for this appointment within a day or two after you arrive home to insure a convenient appointment time.   WHEN TO CALL YOUR DOCTOR: 1. Fever over 101.0 2. Inability to urinate 3. Continued bleeding from  incision. 4. Increased pain, redness, or drainage from the incision. 5. Increasing abdominal pain  The clinic staff is available to answer your questions during regular business hours.  Please don't hesitate to call and ask to speak to one of the nurses for clinical concerns.  If you have a medical emergency, go to the nearest emergency room or call 911.  A surgeon from [facility] is always on call at the hospital. [address], [address], [place], [place]  [address] ? [address] [address], [place], [place]   [address] [phone] ? [phone] ? FAX [phone]  .........   Managing Your Pain After Surgery Without Opioids    Thank you for participating in our program to help patients manage their pain after surgery without opioids. This is part of our effort to provide you with the best care possible, without exposing you or your family to the risk that opioids pose.  What pain can I expect after surgery? You can expect to have some pain after surgery. This is normal. The pain is typically worse the day after surgery, and quickly begins to get better. Many studies have found that many patients are able to manage their pain after surgery with Over-the-Counter (OTC) medications such as Tylenol and Motrin. If you have a condition that does not allow you to take Tylenol or Motrin, notify your surgical team.  How will I manage my pain? The best strategy for controlling your pain after surgery is around the clock pain control with Tylenol (acetaminophen) and Motrin (ibuprofen or Advil). Alternating these medications with each other allows you to maximize your pain control. In addition to Tylenol and Motrin, you can use heating pads or ice packs on your incisions to help reduce your pain.  How will I alternate your regular strength over-the-counter pain medication? You will take a dose of pain medication every three hours. ; Start by taking 650 mg of Tylenol (2 pills of 325  mg) ; 3 hours later take 600 mg of Motrin (3 pills of 200 mg) ; 3 hours after taking the Motrin take 650 mg of Tylenol ; 3 hours after that take 600 mg of Motrin.   - 1 -  See example - if your first dose of Tylenol is at 12:00 PM   12:00 PM Tylenol 650 mg (2 pills of 325 mg)  3:00 PM Motrin 600 mg (3 pills of 200 mg)  6:00 PM Tylenol 650 mg (2 pills of 325 mg)  9:00 PM Motrin 600 mg (3 pills of 200 mg)  Continue alternating every 3 hours   We recommend that you follow this schedule around-the-clock for at least 3 days after surgery, or until you feel that it is no longer needed. Use the table on the last page of this handout to keep track of the medications you are taking. Important: Do not take more than 3000mg of Tylenol or 3200mg of Motrin in a 24-hour period. Do not take ibuprofen/Motrin if you have a history of bleeding stomach ulcers, severe kidney disease, &/or actively taking a blood thinner  What if I still have pain? If you have pain that is not   controlled with the over-the-counter pain medications (Tylenol and Motrin or Advil) you might have what we call "breakthrough" pain. You will receive a prescription for a small amount of an opioid pain medication such as Oxycodone, Tramadol, or Tylenol with Codeine. Use these opioid pills in the first 24 hours after surgery if you have breakthrough pain. Do not take more than 1 pill every 4-6 hours.  If you still have uncontrolled pain after using all opioid pills, don't hesitate to call our staff using the number provided. We will help make sure you are managing your pain in the best way possible, and if necessary, we can provide a prescription for additional pain medication.   Day 1    Time  Name of Medication Number of pills taken  Amount of Acetaminophen  Pain Level   Comments  AM PM       AM PM       AM PM       AM PM       AM PM       AM PM       AM PM       AM PM       Total Daily amount of Acetaminophen Do not  take more than  3,000 mg per day      Day 2    Time  Name of Medication Number of pills taken  Amount of Acetaminophen  Pain Level   Comments  AM PM       AM PM       AM PM       AM PM       AM PM       AM PM       AM PM       AM PM       Total Daily amount of Acetaminophen Do not take more than  3,000 mg per day      Day 3    Time  Name of Medication Number of pills taken  Amount of Acetaminophen  Pain Level   Comments  AM PM       AM PM       AM PM       AM PM          AM PM       AM PM       AM PM       AM PM       Total Daily amount of Acetaminophen Do not take more than  3,000 mg per day      Day 4    Time  Name of Medication Number of pills taken  Amount of Acetaminophen  Pain Level   Comments  AM PM       AM PM       AM PM       AM PM       AM PM       AM PM       AM PM       AM PM       Total Daily amount of Acetaminophen Do not take more than  3,000 mg per day      Day 5    Time  Name of Medication Number of pills taken  Amount of Acetaminophen  Pain Level   Comments  AM PM       AM PM       AM   PM       AM PM       AM PM       AM PM       AM PM       AM PM       Total Daily amount of Acetaminophen Do not take more than  3,000 mg per day       Day 6    Time  Name of Medication Number of pills taken  Amount of Acetaminophen  Pain Level  Comments  AM PM       AM PM       AM PM       AM PM       AM PM       AM PM       AM PM       AM PM       Total Daily amount of Acetaminophen Do not take more than  3,000 mg per day      Day 7    Time  Name of Medication Number of pills taken  Amount of Acetaminophen  Pain Level   Comments  AM PM       AM PM       AM PM       AM PM       AM PM       AM PM       AM PM       AM PM       Total Daily amount of Acetaminophen Do not take more than  3,000 mg per day        For additional information about how and where to safely dispose of unused  opioid medications - https://www.morepowerfulnc.org  Disclaimer: This document contains information and/or instructional materials adapted from [facility] for the typical patient with your condition. It does not replace medical advice from your health care provider because your experience may differ from that of the typical patient. Talk to your health care provider if you have any questions about this document, your condition or your treatment plan. Adapted from [facility]   

## 2020-08-25 NOTE — Interval H&P Note (Signed)
History and Physical Interval Note:  08/25/2020 10:07 AM  [name]  has presented today for surgery, with the diagnosis of Appendicitis.  The various methods of treatment have been discussed with the patient and family. After consideration of risks, benefits and other options for treatment, the patient has consented to  Procedure(s): APPENDECTOMY LAPAROSCOPIC (N/A) as a surgical intervention.  The patient's history has been reviewed, patient examined, no change in status, stable for surgery.  I have reviewed the patient's chart and labs.  Questions were answered to the patient's satisfaction.     Wynona Luna

## 2020-08-25 NOTE — Anesthesia Procedure Notes (Signed)
Procedure Name: Intubation Performed by: Colin Benton, CRNA Pre-anesthesia Checklist: Patient identified, Emergency Drugs available, Suction available and Patient being monitored Patient Re-evaluated:Patient Re-evaluated prior to induction Oxygen Delivery Method: Circle system utilized Preoxygenation: Pre-oxygenation with 100% oxygen Induction Type: IV induction Ventilation: Mask ventilation without difficulty Laryngoscope Size: Mac and 3 Grade View: Grade II Tube type: Oral Number of attempts: 1 Airway Equipment and Method: Stylet and Oral airway Placement Confirmation: ETT inserted through vocal cords under direct vision,  positive ETCO2 and breath sounds checked- equal and bilateral Tube secured with: Tape Dental Injury: Teeth and Oropharynx as per pre-operative assessment

## 2020-08-25 NOTE — Progress Notes (Signed)
Spoke with Channing Mutters from pharmacy to ensure compatibility of IV fluid with Zosyn, compatibility confirmed

## 2020-08-26 ENCOUNTER — Encounter (HOSPITAL_COMMUNITY): Payer: Self-pay | Admitting: Surgery

## 2020-08-26 LAB — SURGICAL PATHOLOGY

## 2021-10-10 IMAGING — CT CT ABD-PELV W/ CM
2 of 4 series · 14 of 46 positions shown, 16 images · IV contrast (APPLIED)
Comparison: CT 02/09/2013
COMPARISON: CT 02/09/2013

Addendum:
CLINICAL DATA: Right abdominal pain which began in the right upper
quadrant progressing inferiorly, stabbing pain, nausea and vomiting

EXAM:
CT ABDOMEN AND PELVIS WITH CONTRAST
TECHNIQUE: Multidetector CT imaging of the abdomen and pelvis was performed
using the standard protocol following bolus administration of
intravenous contrast.
CONTRAST:  100mL OMNIPAQUE IOHEXOL 300 MG/ML  SOLN

[Series 3: abdomen 5.0 · axial · 0.96mm/px · z∈[-519,-99]mm · 11 of 94 slices shown, 13 images]
[im 5/94  soft-tissue]
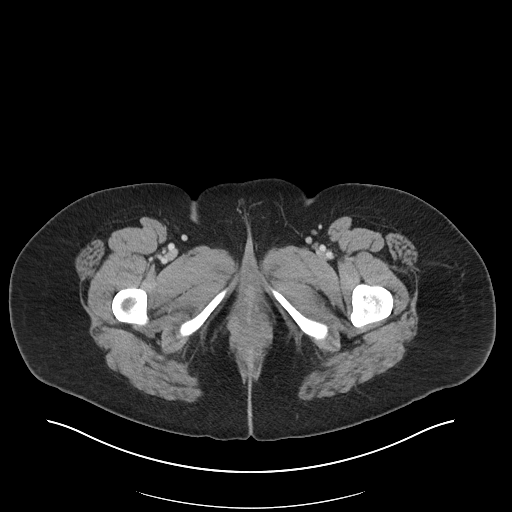
[im 5/94  bone]
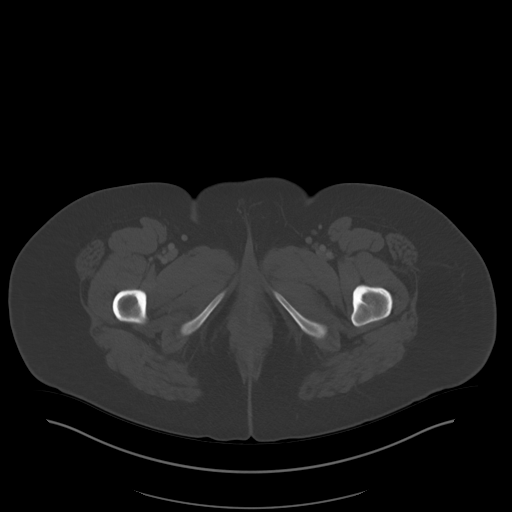
[im 15/94  soft-tissue]
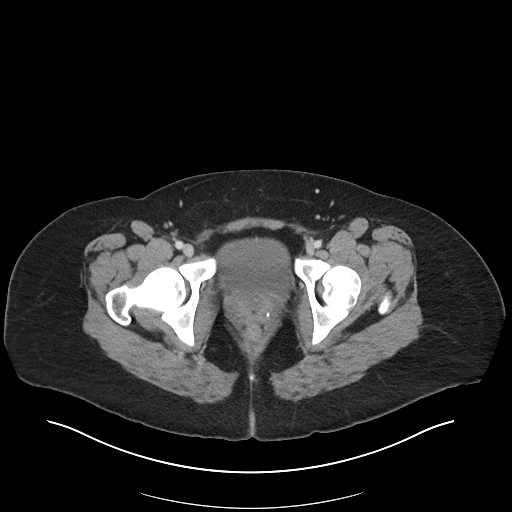
[im 25/94  soft-tissue]
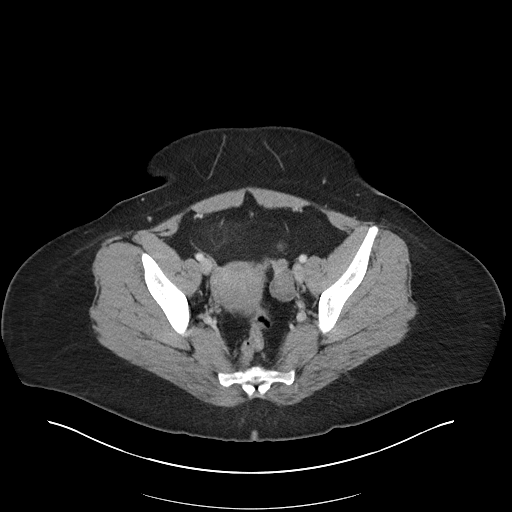
[im 30/94  soft-tissue]
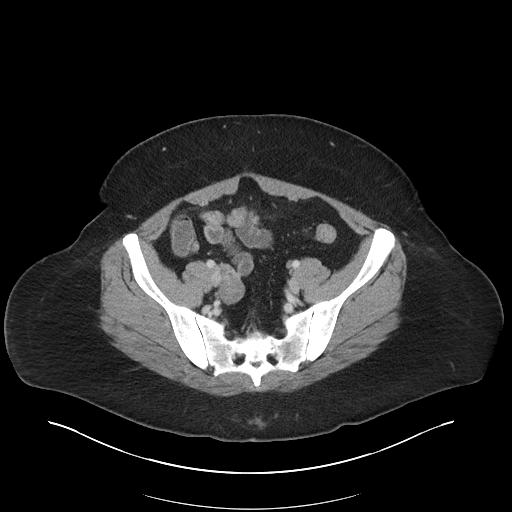
[im 40/94  soft-tissue]
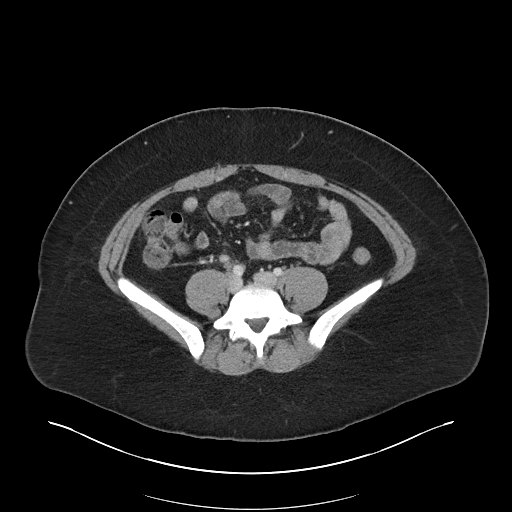
[im 49/94  soft-tissue]
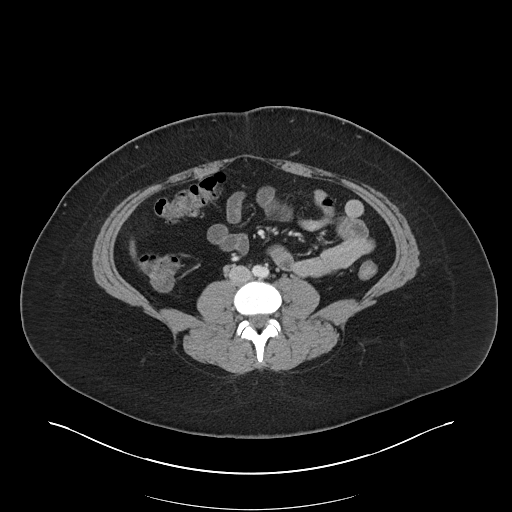
[im 54/94  soft-tissue]
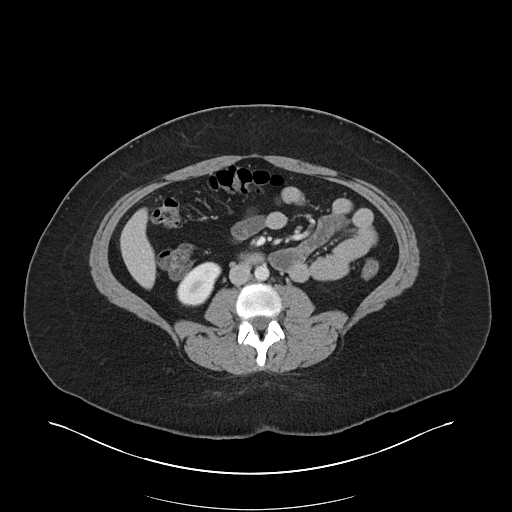
[im 64/94  soft-tissue]
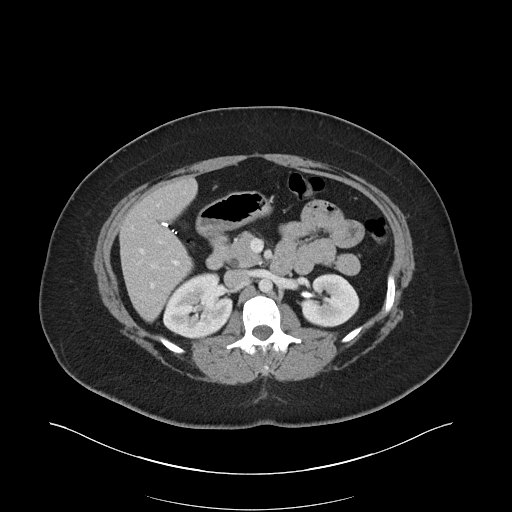
[im 69/94  soft-tissue]
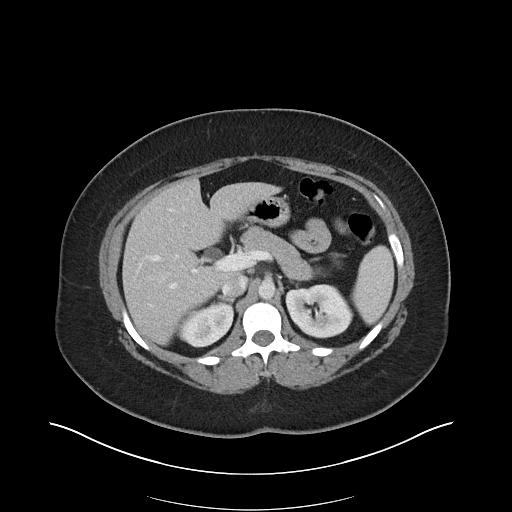
[im 69/94  bone]
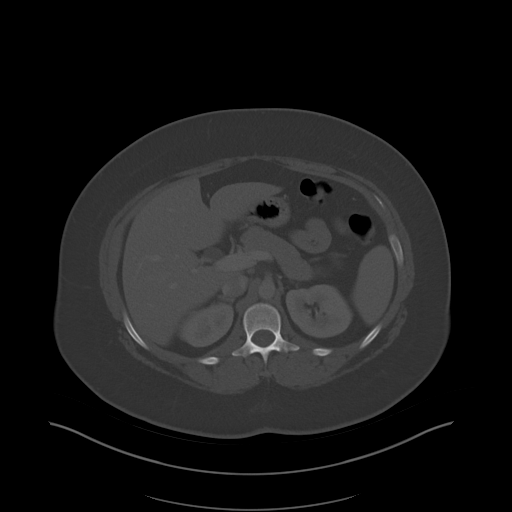
[im 79/94  soft-tissue]
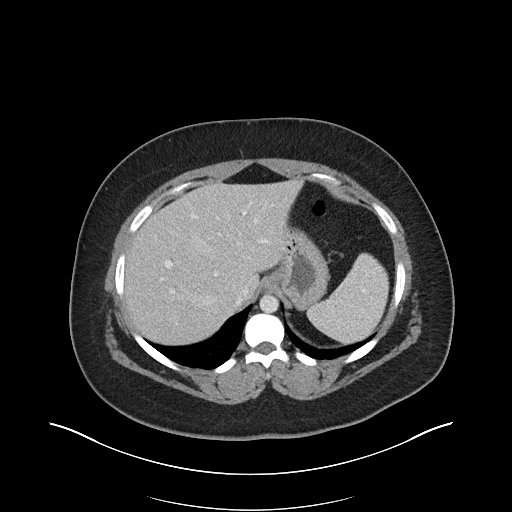
[im 89/94  soft-tissue]
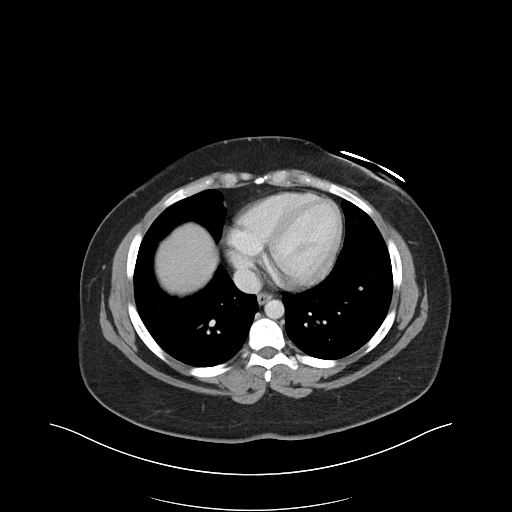

[Series 6: abdomen 3.0 mpr cor · coronal · 0.94mm/px · 3 of 109 slices shown]
[im 37/109  soft-tissue]
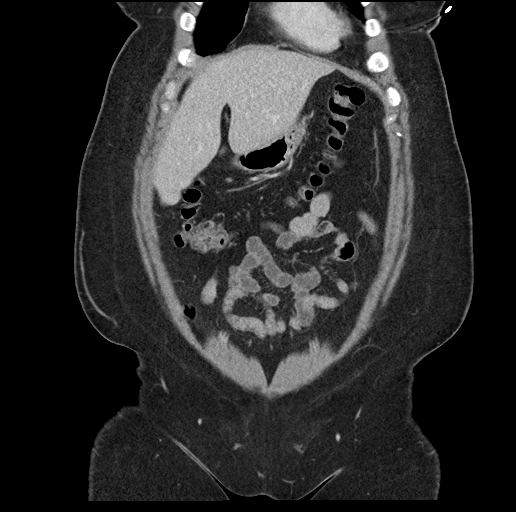
[im 49/109  soft-tissue]
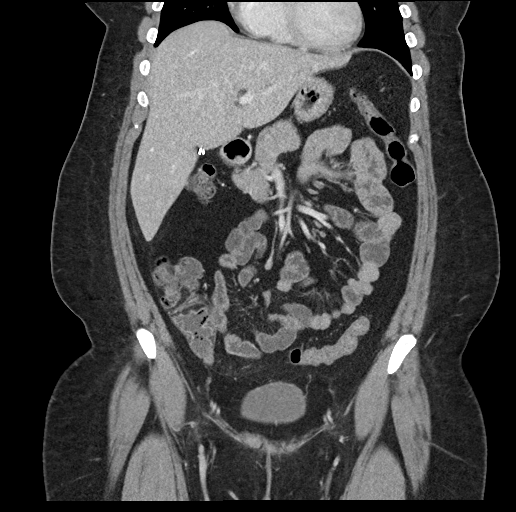
[im 61/109  soft-tissue]
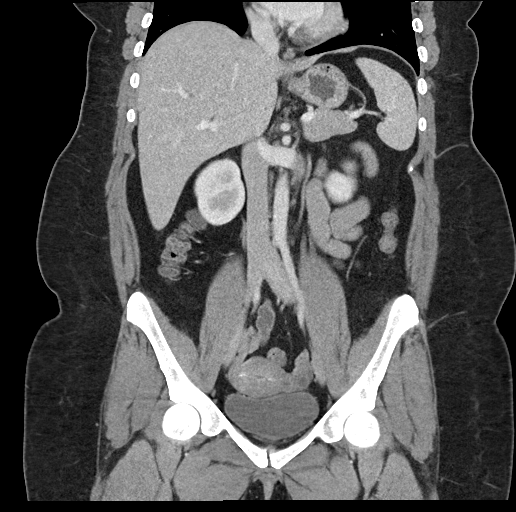

[14 of 46 positions shown; findings below may reference images not displayed]

FINDINGS: Lower chest: Lung bases are clear. Normal heart size. No pericardial
effusion.

Hepatobiliary: No worrisome focal liver lesions. Smooth liver
surface contour. Normal hepatic attenuation. Prior cholecystectomy.
Prominence of the biliary tree likely related to post cholecystic
reservoir effect without visible calcified gallstone.

Pancreas: No pancreatic ductal dilatation or surrounding
inflammatory changes.

Spleen: Normal in size. No concerning splenic lesions.

Adrenals/Urinary Tract: Normal adrenal glands. Kidneys are normally
located with symmetric enhancement. No suspicious renal lesion,
urolithiasis or hydronephrosis. Urinary bladder is unremarkable.

Stomach/Bowel: Distal esophagus, stomach and duodenal sweep are
unremarkable. No small bowel wall thickening or dilatation. A
borderline dilated, hyperemic appearing fluid-filled appendix is
seen extending medially from the cecal tip terminating near midline
(3/68). Faint adjacent periappendiceal stranding is present,
particularly towards the appendiceal base. No extraluminal gas,
organized collection or abscess is seen. No colonic dilatation or
wall thickening. No evidence of bowel obstruction.

Vascular/Lymphatic: No significant vascular findings are present. No
enlarged abdominal or pelvic lymph nodes.

Reproductive: Anteverted uterus. Atypical rotation of the patient's
intrauterine device within the endometrial canal with the lateral
limbs extending towards the lower uterine segment and isthmus and
the base of the IUD directed towards the right isthmus. No clear
intramural perforation is seen however. No worrisome adnexal
lesions.

Other: No abdominopelvic free fluid or free gas. No bowel containing
hernias.

Musculoskeletal: No acute osseous abnormality or suspicious osseous
lesion.
IMPRESSION: 1. Dilated, hyperemic appendix extending medially from the cecal tip
terminating near midline. Faint adjacent periappendiceal stranding
is present, particularly towards the appendiceal base. Findings are
compatible with an acute appendicitis in the appropriate clinical
context. No evidence of perforation or abscess formation.
2. Atypical rotation of the patient's intrauterine device within the
endometrial canal. No evidence of mural perforation.
3. Prior cholecystectomy. Prominence of the biliary tree likely
related to post cholecystic reservoir effect without visible
calcified gallstone.

ADDENDUM:
Small 13 mm fluid attenuation cystic structure is seen in the
expected vicinity of the urethral meatus slightly to the right of
midline. Could reflect a small periurethral/Khouna [place] cyst versus
urethral diverticulum.

*** End of Addendum ***
FINDINGS: Lower chest: Lung bases are clear. Normal heart size. No pericardial
effusion.

Hepatobiliary: No worrisome focal liver lesions. Smooth liver
surface contour. Normal hepatic attenuation. Prior cholecystectomy.
Prominence of the biliary tree likely related to post cholecystic
reservoir effect without visible calcified gallstone.

Pancreas: No pancreatic ductal dilatation or surrounding
inflammatory changes.

Spleen: Normal in size. No concerning splenic lesions.

Adrenals/Urinary Tract: Normal adrenal glands. Kidneys are normally
located with symmetric enhancement. No suspicious renal lesion,
urolithiasis or hydronephrosis. Urinary bladder is unremarkable.

Stomach/Bowel: Distal esophagus, stomach and duodenal sweep are
unremarkable. No small bowel wall thickening or dilatation. A
borderline dilated, hyperemic appearing fluid-filled appendix is
seen extending medially from the cecal tip terminating near midline
(3/68). Faint adjacent periappendiceal stranding is present,
particularly towards the appendiceal base. No extraluminal gas,
organized collection or abscess is seen. No colonic dilatation or
wall thickening. No evidence of bowel obstruction.

Vascular/Lymphatic: No significant vascular findings are present. No
enlarged abdominal or pelvic lymph nodes.

Reproductive: Anteverted uterus. Atypical rotation of the patient's
intrauterine device within the endometrial canal with the lateral
limbs extending towards the lower uterine segment and isthmus and
the base of the IUD directed towards the right isthmus. No clear
intramural perforation is seen however. No worrisome adnexal
lesions.

Other: No abdominopelvic free fluid or free gas. No bowel containing
hernias.

Musculoskeletal: No acute osseous abnormality or suspicious osseous
lesion.
IMPRESSION: 1. Dilated, hyperemic appendix extending medially from the cecal tip
terminating near midline. Faint adjacent periappendiceal stranding
is present, particularly towards the appendiceal base. Findings are
compatible with an acute appendicitis in the appropriate clinical
context. No evidence of perforation or abscess formation.
2. Atypical rotation of the patient's intrauterine device within the
endometrial canal. No evidence of mural perforation.
3. Prior cholecystectomy. Prominence of the biliary tree likely
related to post cholecystic reservoir effect without visible
calcified gallstone.
